# Patient Record
Sex: Male | Born: 1940 | Race: White | Hispanic: No | Smoking: Former smoker
Health system: Southern US, Community
[De-identification: ages and names within clinical notes are randomized; demographics above are authoritative.]

## PROBLEM LIST (undated history)

## (undated) DIAGNOSIS — I10 Essential (primary) hypertension: Secondary | ICD-10-CM

## (undated) DIAGNOSIS — C801 Malignant (primary) neoplasm, unspecified: Secondary | ICD-10-CM

## (undated) DIAGNOSIS — I4891 Unspecified atrial fibrillation: Secondary | ICD-10-CM

## (undated) HISTORY — PX: OTHER SURGICAL HISTORY: SHX169

## (undated) HISTORY — DX: Essential (primary) hypertension: I10

## (undated) HISTORY — DX: Unspecified atrial fibrillation: I48.91

---

## 2016-01-01 ENCOUNTER — Encounter: Payer: Self-pay | Admitting: Cardiology

## 2016-01-01 ENCOUNTER — Telehealth: Payer: Self-pay | Admitting: Cardiology

## 2016-01-01 NOTE — Telephone Encounter (Signed)
Received records from [facility] for appointment on 01/22/16 with Dr Percival Spanish.  Records given to [facility] (medical records) for Dr Hochrein's schedule on 01/22/16. lp

## 2016-01-18 ENCOUNTER — Telehealth: Payer: Self-pay | Admitting: Cardiology

## 2016-01-21 NOTE — Progress Notes (Signed)
Cardiology Office Note   Date:  01/22/2016   ID:  Jacob Singleton, DOB [birth year], MRN [id number]  PCP:  Olin Hauser, MD  Cardiologist:   Minus Breeding, MD   Chief Complaint  Patient presents with  . Atrial Fibrillation      History of Present Illness: Jacob Singleton is a 75 y.o. male who presents for evaluation of atrial fibrillation. He lives part time in [place] and was noted to have this incidentally during an exam down there. He saw primary provider here and then asked for referral to cardiology. He was given some Eliquis and started this. He stopped his other medications wondering if he could combine them. He is a very active 75 year old gentleman who looks much younger than his stated age. He does notice a little palpitation now and again since he's been told he had a fibrillation. However, he otherwise wasn't noticing it. He was able to exercise without any symptoms. The patient denies any new symptoms such as chest discomfort, neck or arm discomfort. There has been no new shortness of breath, PND or orthopnea. There have been no reported palpitations, presyncope or syncope.  He's had no prior cardiac history. He did have labs and I reviewed these.   Past Medical History:  Diagnosis Date  . A-fib ([place])   . Hypertension     Past Surgical History:  Procedure Laterality Date  . NONE       Current Outpatient Prescriptions  Medication Sig Dispense Refill  . apixaban (ELIQUIS) 5 MG TABS tablet Take 5 mg by mouth 2 (two) times daily.    Marland Kitchen HYDROCHLOROTHIAZIDE PO Take 1 tablet by mouth daily.    Marland Kitchen latanoprost (XALATAN) 0.005 % ophthalmic solution Place 1 drop into both eyes at bedtime.    . TURMERIC PO Take 1 tablet by mouth daily.     No current facility-administered medications for this visit.     Allergies:   Codeine    Social History:  The patient  reports that he quit smoking about 4 years ago. He has never used smokeless tobacco. He reports that he drinks  alcohol. He reports that he does not use drugs.   Family History:  The patient's family history includes Colon cancer in his mother; Rheum arthritis in his sister.    ROS:  Please see the history of present illness.   Otherwise, review of systems are positive for none.   All other systems are reviewed and negative.    PHYSICAL EXAM: VS:  BP 138/82   Pulse 86   Ht 5\' 11"  (1.803 m)   Wt 167 lb (75.8 kg)   BMI 23.29 kg/m  , BMI Body mass index is 23.29 kg/m. GENERAL:  Well appearing HEENT:  Pupils equal round and reactive, fundi not visualized, oral mucosa unremarkable NECK:  No jugular venous distention, waveform within normal limits, carotid upstroke brisk and symmetric, no bruits, no thyromegaly LYMPHATICS:  No cervical, inguinal adenopathy LUNGS:  Clear to auscultation bilaterally BACK:  No CVA tenderness CHEST:  Unremarkable HEART:  PMI not displaced or sustained,S1 and S2 within normal limits, no S3, , no clicks, no rubs, no murmurs, irregular ABD:  Flat, positive bowel sounds normal in frequency in pitch, no bruits, no rebound, no guarding, no midline pulsatile mass, no hepatomegaly, no splenomegaly EXT:  2 plus pulses throughout, no edema, no cyanosis no clubbing SKIN:  No rashes no nodules NEURO:  Cranial nerves II through XII grossly intact, motor grossly intact  throughout [facility]:  Cognitively intact, oriented to person place and time    EKG:  EKG is not ordered today. The ekg ordered to7/7/17 day demonstrates atrial fibrillation, rate 76, axis within normal limits, intervals within normal limits, no acute ST-T wave changes.   Recent Labs: No results found for requested labs within last 8760 hours.    Lipid Panel No results found for: CHOL, TRIG, HDL, CHOLHDL, VLDL, LDLCALC, LDLDIRECT    Wt Readings from Last 3 Encounters:  01/22/16 167 lb (75.8 kg)      Other studies Reviewed: Additional studies/ records that were reviewed today include: Office records, labs  and EKGs. Review of the above records demonstrates:  Please see elsewhere in the note.     ASSESSMENT AND PLAN:  ATRIAL FIB:  This seems to be persistent. I saw the EKG from May and again from July. We did do an EKG today but his pulse is irregular.  Mr. Jacob Singleton has a CHA2DS2 - VASc score of 2 with a risk of stroke of 2.2%.  We talked at length about the risks benefits and anticoagulation and I agree with the on Eliquis and he will continue to take this. He's not taking aspirin. I think he has good rate control but we are going to plan at least one attempt at cardioversion. This was discussed with him and he agrees to proceed. Should he converted back to fibrillation or be unsuccessful with his defibrillation would probably would pursue rate control and continued anticoagulation. I will check an echocardiogram. I didn't see a TSH and will draw this when he returns.  HTN:  He stopped taking his HCTZ recently but he does carry this diagnosis of hypertension. He's going to keep a blood pressure diary and I will treat this accordingly.  Current medicines are reviewed at length with the patient today.  The patient has concerns regarding medicines.  The following changes have been made:  As above  Labs/ tests ordered today include:   Orders Placed This Encounter  Procedures  . ELECTRICAL CARDIOVERSION  . COMPLETE METABOLIC PANEL WITH GFR  . CBC  . TSH  . INR/PT  . APTT  . ECHOCARDIOGRAM COMPLETE     Disposition:   FU with me after the DCCV.     Signed, Minus Breeding, MD  01/22/2016 1:33 PM    [facility]

## 2016-01-22 ENCOUNTER — Ambulatory Visit (INDEPENDENT_AMBULATORY_CARE_PROVIDER_SITE_OTHER): Payer: Medicare Other | Admitting: Cardiology

## 2016-01-22 ENCOUNTER — Encounter: Payer: Self-pay | Admitting: Cardiology

## 2016-01-22 VITALS — BP 138/82 | HR 86 | Ht 71.0 in | Wt 167.0 lb

## 2016-01-22 DIAGNOSIS — I10 Essential (primary) hypertension: Secondary | ICD-10-CM

## 2016-01-22 DIAGNOSIS — Z79899 Other long term (current) drug therapy: Secondary | ICD-10-CM

## 2016-01-22 DIAGNOSIS — I481 Persistent atrial fibrillation: Secondary | ICD-10-CM | POA: Diagnosis not present

## 2016-01-22 DIAGNOSIS — I4819 Other persistent atrial fibrillation: Secondary | ICD-10-CM

## 2016-01-22 NOTE — Patient Instructions (Addendum)
Medication Instructions:  Continue current medications  Labwork: NONE   Testing/Procedures: Your physician has requested that you have an echocardiogram. Echocardiography is a painless test that uses sound waves to create images of your heart. It provides your doctor with information about the size and shape of your heart and how well your heart's chambers and valves are working. This procedure takes approximately one hour. There are no restrictions for this procedure. Your physician has recommended that you have a Cardioversion (DCCV) in 3 weeks. Electrical Cardioversion uses a jolt of electricity to your heart either through paddles or wired patches attached to your chest. This is a controlled, usually prescheduled, procedure. Defibrillation is done under light anesthesia in the hospital, and you usually go home the day of the procedure. This is done to get your heart back into a normal rhythm. You are not awake for the procedure. Please see the instruction sheet given to you today.  Follow-Up: Your physician recommends that you schedule a follow-up appointment in: 1 week after Cardioversion   Any Other Special Instructions Will Be Listed Below (If Applicable).   If you need a refill on your cardiac medications before your next appointment, please call your pharmacy.

## 2016-01-25 NOTE — Telephone Encounter (Signed)
Closed encounter °

## 2016-01-28 NOTE — Addendum Note (Signed)
Addended by: Cristopher Estimable on: 01/28/2016 11:26 AM   Modules accepted: Orders

## 2016-02-02 ENCOUNTER — Ambulatory Visit (HOSPITAL_COMMUNITY): Payer: Medicare Other | Attending: Cardiology

## 2016-02-02 ENCOUNTER — Other Ambulatory Visit: Payer: Self-pay

## 2016-02-02 DIAGNOSIS — I358 Other nonrheumatic aortic valve disorders: Secondary | ICD-10-CM | POA: Insufficient documentation

## 2016-02-02 DIAGNOSIS — I481 Persistent atrial fibrillation: Secondary | ICD-10-CM | POA: Insufficient documentation

## 2016-02-02 DIAGNOSIS — I119 Hypertensive heart disease without heart failure: Secondary | ICD-10-CM | POA: Diagnosis not present

## 2016-02-02 DIAGNOSIS — I351 Nonrheumatic aortic (valve) insufficiency: Secondary | ICD-10-CM | POA: Insufficient documentation

## 2016-02-02 DIAGNOSIS — I4819 Other persistent atrial fibrillation: Secondary | ICD-10-CM

## 2016-02-02 DIAGNOSIS — I4891 Unspecified atrial fibrillation: Secondary | ICD-10-CM | POA: Diagnosis present

## 2016-02-04 ENCOUNTER — Telehealth: Payer: Self-pay | Admitting: Cardiology

## 2016-02-04 NOTE — Telephone Encounter (Signed)
New Message  Pt is returing Jacob Singleton call from yesterday but Hebert Soho is not in the office today.  Please Follow up with pt.

## 2016-02-04 NOTE — Telephone Encounter (Signed)
Returned patient call: made aware of echo results.  Notes Recorded by Minus Breeding, MD on 02/03/2016 at 7:26 AM EDT Echo is essentially unremarkable.  EF is low normal. No change in therapy. Call Mr. Vantuyl with the results and send results to Olin Hauser, MD  Sent to PCP on 8/9.     Pt verbalized understanding and denies further questions/concerns at this time.

## 2016-02-05 LAB — COMPLETE METABOLIC PANEL WITH GFR
ALT: 14 U/L (ref 9–46)
AST: 22 U/L (ref 10–35)
Albumin: 4.1 g/dL (ref 3.6–5.1)
Alkaline Phosphatase: 91 U/L (ref 40–115)
BILIRUBIN TOTAL: 0.6 mg/dL (ref 0.2–1.2)
BUN: 8 mg/dL (ref 7–25)
CHLORIDE: 101 mmol/L (ref 98–110)
CO2: 27 mmol/L (ref 20–31)
Calcium: 9.8 mg/dL (ref 8.6–10.3)
Creat: 0.98 mg/dL (ref 0.70–1.18)
GFR, EST AFRICAN AMERICAN: 87 mL/min (ref >=60)
GFR, EST NON AFRICAN AMERICAN: 76 mL/min (ref >=60)
Glucose, Bld: 93 mg/dL (ref 65–99)
Potassium: 3.9 mmol/L (ref 3.5–5.3)
Sodium: 137 mmol/L (ref 135–146)
Total Protein: 6.5 g/dL (ref 6.1–8.1)

## 2016-02-05 LAB — CBC
HEMATOCRIT: 47.3 % (ref 38.5–50.0)
Hemoglobin: 16.2 g/dL (ref 13.2–17.1)
MCH: 31.7 pg (ref 27.0–33.0)
MCHC: 34.2 g/dL (ref 32.0–36.0)
MCV: 92.6 fL (ref 80.0–100.0)
MPV: 9.4 fL (ref 7.5–12.5)
PLATELETS: 204 10*3/uL (ref 140–400)
RBC: 5.11 MIL/uL (ref 4.20–5.80)
RDW: 13.3 % (ref 11.0–15.0)
WBC: 7 10*3/uL (ref 3.8–10.8)

## 2016-02-05 LAB — APTT: aPTT: 29 s (ref 22–34)

## 2016-02-05 LAB — PROTIME-INR
INR: 1.1
Prothrombin Time: 11.4 s (ref 9.0–11.5)

## 2016-02-05 LAB — TSH: TSH: 2.05 mIU/L (ref 0.40–4.50)

## 2016-02-09 ENCOUNTER — Other Ambulatory Visit: Payer: Self-pay | Admitting: Cardiology

## 2016-02-10 ENCOUNTER — Other Ambulatory Visit: Payer: Self-pay | Admitting: Cardiovascular Disease

## 2016-02-10 ENCOUNTER — Other Ambulatory Visit: Payer: Self-pay | Admitting: Nurse Practitioner

## 2016-02-10 DIAGNOSIS — I4819 Other persistent atrial fibrillation: Secondary | ICD-10-CM

## 2016-02-11 ENCOUNTER — Ambulatory Visit (HOSPITAL_COMMUNITY)
Admission: RE | Admit: 2016-02-11 | Discharge: 2016-02-11 | Disposition: A | Discharge status: Home / Self Care | Payer: Medicare Other | Source: Ambulatory Visit | Attending: Cardiovascular Disease | Admitting: Cardiovascular Disease

## 2016-02-11 ENCOUNTER — Ambulatory Visit (HOSPITAL_COMMUNITY): Payer: Medicare Other | Admitting: Certified Registered"

## 2016-02-11 ENCOUNTER — Encounter (HOSPITAL_COMMUNITY)
Admission: RE | Disposition: A | Discharge status: Home / Self Care | Payer: Self-pay | Source: Ambulatory Visit | Attending: Cardiovascular Disease

## 2016-02-11 ENCOUNTER — Encounter (HOSPITAL_COMMUNITY): Payer: Self-pay | Admitting: *Deleted

## 2016-02-11 DIAGNOSIS — I4891 Unspecified atrial fibrillation: Secondary | ICD-10-CM | POA: Diagnosis present

## 2016-02-11 DIAGNOSIS — Z7901 Long term (current) use of anticoagulants: Secondary | ICD-10-CM | POA: Diagnosis not present

## 2016-02-11 DIAGNOSIS — Z885 Allergy status to narcotic agent status: Secondary | ICD-10-CM | POA: Insufficient documentation

## 2016-02-11 DIAGNOSIS — I4819 Other persistent atrial fibrillation: Secondary | ICD-10-CM

## 2016-02-11 DIAGNOSIS — I10 Essential (primary) hypertension: Secondary | ICD-10-CM | POA: Insufficient documentation

## 2016-02-11 DIAGNOSIS — Z87891 Personal history of nicotine dependence: Secondary | ICD-10-CM | POA: Diagnosis not present

## 2016-02-11 HISTORY — PX: CARDIOVERSION: SHX1299

## 2016-02-11 SURGERY — CARDIOVERSION
Anesthesia: General

## 2016-02-11 MED ORDER — LIDOCAINE 2% (20 MG/ML) 5 ML SYRINGE
INTRAMUSCULAR | Status: AC
  Filled 2016-02-11: qty 5

## 2016-02-11 MED ORDER — LIDOCAINE 2% (20 MG/ML) 5 ML SYRINGE
INTRAMUSCULAR | Status: DC | PRN
  Administered 2016-02-11: 100 mg via INTRAVENOUS

## 2016-02-11 MED ORDER — SODIUM CHLORIDE 0.9 % IV SOLN
INTRAVENOUS | Status: DC
  Administered 2016-02-11: 500 mL via INTRAVENOUS

## 2016-02-11 MED ORDER — SODIUM CHLORIDE 0.45 % IV SOLN: INTRAVENOUS | Status: DC

## 2016-02-11 MED ORDER — PROPOFOL 10 MG/ML IV BOLUS
INTRAVENOUS | Status: AC
  Filled 2016-02-11: qty 20

## 2016-02-11 MED ORDER — PROPOFOL 10 MG/ML IV BOLUS
INTRAVENOUS | Status: DC | PRN
  Administered 2016-02-11: 100 mg via INTRAVENOUS

## 2016-02-11 NOTE — H&P (View-Only) (Signed)
Cardiology Office Note   Date:  01/22/2016   ID:  Jacob Singleton, DOB Dec 16, 1940, MRN [id number]  PCP:  Olin Hauser, MD  Cardiologist:   Minus Breeding, MD   Chief Complaint  Patient presents with  . Atrial Fibrillation      History of Present Illness: Jacob Singleton is a 75 y.o. male who presents for evaluation of atrial fibrillation. He lives part time in [place] and was noted to have this incidentally during an exam down there. He saw primary provider here and then asked for referral to cardiology. He was given some Eliquis and started this. He stopped his other medications wondering if he could combine them. He is a very active 75 year old gentleman who looks much younger than his stated age. He does notice a little palpitation now and again since he's been told he had a fibrillation. However, he otherwise wasn't noticing it. He was able to exercise without any symptoms. The patient denies any new symptoms such as chest discomfort, neck or arm discomfort. There has been no new shortness of breath, PND or orthopnea. There have been no reported palpitations, presyncope or syncope.  He's had no prior cardiac history. He did have labs and I reviewed these.   Past Medical History:  Diagnosis Date  . A-fib ([place])   . Hypertension     Past Surgical History:  Procedure Laterality Date  . NONE       Current Outpatient Prescriptions  Medication Sig Dispense Refill  . apixaban (ELIQUIS) 5 MG TABS tablet Take 5 mg by mouth 2 (two) times daily.    Marland Kitchen HYDROCHLOROTHIAZIDE PO Take 1 tablet by mouth daily.    Marland Kitchen latanoprost (XALATAN) 0.005 % ophthalmic solution Place 1 drop into both eyes at bedtime.    . TURMERIC PO Take 1 tablet by mouth daily.     No current facility-administered medications for this visit.     Allergies:   Codeine    Social History:  The patient  reports that he quit smoking about 4 years ago. He has never used smokeless tobacco. He reports that he drinks  alcohol. He reports that he does not use drugs.   Family History:  The patient's family history includes Colon cancer in his mother; Rheum arthritis in his sister.    ROS:  Please see the history of present illness.   Otherwise, review of systems are positive for none.   All other systems are reviewed and negative.    PHYSICAL EXAM: VS:  BP 138/82   Pulse 86   Ht 5\' 11"  (1.803 m)   Wt 167 lb (75.8 kg)   BMI 23.29 kg/m  , BMI Body mass index is 23.29 kg/m. GENERAL:  Well appearing HEENT:  Pupils equal round and reactive, fundi not visualized, oral mucosa unremarkable NECK:  No jugular venous distention, waveform within normal limits, carotid upstroke brisk and symmetric, no bruits, no thyromegaly LYMPHATICS:  No cervical, inguinal adenopathy LUNGS:  Clear to auscultation bilaterally BACK:  No CVA tenderness CHEST:  Unremarkable HEART:  PMI not displaced or sustained,S1 and S2 within normal limits, no S3, , no clicks, no rubs, no murmurs, irregular ABD:  Flat, positive bowel sounds normal in frequency in pitch, no bruits, no rebound, no guarding, no midline pulsatile mass, no hepatomegaly, no splenomegaly EXT:  2 plus pulses throughout, no edema, no cyanosis no clubbing SKIN:  No rashes no nodules NEURO:  Cranial nerves II through XII grossly intact, motor grossly intact  throughout Jacob Singleton:  Cognitively intact, oriented to person place and time    EKG:  EKG is not ordered today. The ekg ordered to7/7/17 day demonstrates atrial fibrillation, rate 76, axis within normal limits, intervals within normal limits, no acute ST-T wave changes.   Recent Labs: No results found for requested labs within last 8760 hours.    Lipid Panel No results found for: CHOL, TRIG, HDL, CHOLHDL, VLDL, LDLCALC, LDLDIRECT    Wt Readings from Last 3 Encounters:  01/22/16 167 lb (75.8 kg)      Other studies Reviewed: Additional studies/ records that were reviewed today include: Office records, labs  and EKGs. Review of the above records demonstrates:  Please see elsewhere in the note.     ASSESSMENT AND PLAN:  ATRIAL FIB:  This seems to be persistent. I saw the EKG from May and again from July. We did do an EKG today but his pulse is irregular.  Jacob Singleton has a CHA2DS2 - VASc score of 2 with a risk of stroke of 2.2%.  We talked at length about the risks benefits and anticoagulation and I agree with the on Eliquis and he will continue to take this. He's not taking aspirin. I think he has good rate control but we are going to plan at least one attempt at cardioversion. This was discussed with him and he agrees to proceed. Should he converted back to fibrillation or be unsuccessful with his defibrillation would probably would pursue rate control and continued anticoagulation. I will check an echocardiogram. I didn't see a TSH and will draw this when he returns.  HTN:  He stopped taking his HCTZ recently but he does carry this diagnosis of hypertension. He's going to keep a blood pressure diary and I will treat this accordingly.  Current medicines are reviewed at length with the patient today.  The patient has concerns regarding medicines.  The following changes have been made:  As above  Labs/ tests ordered today include:   Orders Placed This Encounter  Procedures  . ELECTRICAL CARDIOVERSION  . COMPLETE METABOLIC PANEL WITH GFR  . CBC  . TSH  . INR/PT  . APTT  . ECHOCARDIOGRAM COMPLETE     Disposition:   FU with me after the DCCV.     Signed, Minus Breeding, MD  01/22/2016 1:33 PM    [facility]

## 2016-02-11 NOTE — Discharge Instructions (Signed)
Electrical Cardioversion, Care After °Refer to this sheet in the next few weeks. These instructions provide you with information on caring for yourself after your procedure. Your health care provider may also give you more specific instructions. Your treatment has been planned according to current medical practices, but problems sometimes occur. Call your health care provider if you have any problems or questions after your procedure. °WHAT TO EXPECT AFTER THE PROCEDURE °After your procedure, it is typical to have the following sensations: °· Some redness on the skin where the shocks were delivered. If this is tender, a sunburn lotion or hydrocortisone cream may help. °· Possible return of an abnormal heart rhythm within hours or days after the procedure. °HOME CARE INSTRUCTIONS °· Take medicines only as directed by your health care provider. Be sure you understand how and when to take your medicine. °· Learn how to feel your pulse and check it often. °· Limit your activity for 48 hours after the procedure or as directed by your health care provider. °· Avoid or minimize caffeine and other stimulants as directed by your health care provider. °SEEK MEDICAL CARE IF: °· You feel like your heart is beating too fast or your pulse is not regular. °· You have any questions about your medicines. °· You have bleeding that will not stop. °SEEK IMMEDIATE MEDICAL CARE IF: °· You are dizzy or feel faint. °· It is hard to breathe or you feel short of breath. °· There is a change in discomfort in your chest. °· Your speech is slurred or you have trouble moving an arm or leg on one side of your body. °· You get a serious muscle cramp that does not go away. °· Your fingers or toes turn cold or blue. °  °This information is not intended to replace advice given to you by your health care provider. Make sure you discuss any questions you have with your health care provider. °  °Document Released: 04/03/2013 Document Revised: 07/04/2014  Document Reviewed: 04/03/2013 °Elsevier Interactive Patient Education ©2016 [employer] ° °

## 2016-02-11 NOTE — Interval H&P Note (Signed)
History and Physical Interval Note:  02/11/2016 12:36 PM  Jacob Singleton  has presented today for surgery, with the diagnosis of AFIB  The various methods of treatment have been discussed with the patient and family. After consideration of risks, benefits and other options for treatment, the patient has consented to  Procedure(s): CARDIOVERSION (N/A) as a surgical intervention .  The patient's history has been reviewed, patient examined, no change in status, stable for surgery.  I have reviewed the patient's chart and labs.  Questions were answered to the patient's satisfaction.     Jenkins Rouge

## 2016-02-11 NOTE — CV Procedure (Addendum)
DCC: Anesthesia Propofol  ON Rx Eliquis NPO Shocked x 3 120, 150 and 200 Joules Had long pauses but no sinus capture  Failed [facility]  Per Dr Percival Spanish likely pursue rate control and anticoagulation strategy  Jenkins Rouge

## 2016-02-11 NOTE — Transfer of Care (Signed)
Immediate Anesthesia Transfer of Care Note  Patient: Jacob Singleton  Procedure(s) Performed: Procedure(s): CARDIOVERSION (N/A)  Patient Location: Endoscopy Unit  Anesthesia Type:MAC  Level of Consciousness: awake  Airway & Oxygen Therapy: Patient Spontanous Breathing  Post-op Assessment: Report given to RN  Post vital signs: Reviewed and stable  Last Vitals:  Vitals:   02/11/16 1314 02/11/16 1315  BP:  101/61  Pulse: 94 85  Resp: 19 (!) 22    Last Pain: There were no vitals filed for this visit.       Complications: No apparent anesthesia complications

## 2016-02-11 NOTE — Anesthesia Preprocedure Evaluation (Addendum)
Anesthesia Evaluation  Patient identified by MRN, date of birth, ID band Patient awake    Reviewed: Allergy & Precautions, NPO status , Patient's Chart, lab work & pertinent test results  Airway Mallampati: II  TM Distance: >3 FB Neck ROM: Full    Dental no notable dental hx. (+) Teeth Intact, Dental Advisory Given   Pulmonary neg pulmonary ROS, former smoker,    Pulmonary exam normal breath sounds clear to auscultation       Cardiovascular hypertension, Normal cardiovascular exam+ dysrhythmias  Rhythm:Irregular Rate:Normal     Neuro/Psych negative neurological ROS  negative psych ROS   GI/Hepatic negative GI ROS, Neg liver ROS,   Endo/Other  negative endocrine ROS  Renal/GU negative Renal ROS  negative genitourinary   Musculoskeletal negative musculoskeletal ROS (+)   Abdominal   Peds negative pediatric ROS (+)  Hematology negative hematology ROS (+)   Anesthesia Other Findings   Reproductive/Obstetrics negative OB ROS                            Anesthesia Physical Anesthesia Plan  ASA: II  Anesthesia Plan: General   Post-op Pain Management:    Induction: Intravenous  Airway Management Planned: Mask  Additional Equipment:   Intra-op Plan:   Post-operative Plan:   Informed Consent: I have reviewed the patients History and Physical, chart, labs and discussed the procedure including the risks, benefits and alternatives for the proposed anesthesia with the patient or authorized representative who has indicated his/her understanding and acceptance.   Dental advisory given  Plan Discussed with: CRNA  Anesthesia Plan Comments:         Anesthesia Quick Evaluation

## 2016-02-12 ENCOUNTER — Encounter (HOSPITAL_COMMUNITY): Payer: Self-pay | Admitting: Cardiovascular Disease

## 2016-02-13 NOTE — Anesthesia Postprocedure Evaluation (Signed)
Anesthesia Post Note  Patient: Jacob Singleton  Procedure(s) Performed: Procedure(s) (LRB): CARDIOVERSION (N/A)  Patient location during evaluation: Endoscopy Anesthesia Type: General Level of consciousness: sedated and patient cooperative Pain management: pain level controlled Vital Signs Assessment: post-procedure vital signs reviewed and stable Respiratory status: spontaneous breathing Cardiovascular status: stable Anesthetic complications: no    Last Vitals:  Vitals:   02/11/16 1334 02/11/16 1344  BP: (!) 130/99 (!) 154/77  Pulse: 86 78  Resp: 15 20    Last Pain: There were no vitals filed for this visit.               Nolon Nations

## 2016-02-17 NOTE — Progress Notes (Signed)
Cardiology Office Note   Date:  02/19/2016   ID:  Jacob Singleton, DOB [birth year], MRN [id number]  PCP:  Olin Hauser, MD  Cardiologist:   Minus Breeding, MD   Chief Complaint  Patient presents with  . Atrial Fibrillation      History of Present Illness: Jacob Singleton is a 75 y.o. male who presents for evaluation of atrial fibrillation. He lives part time in [place] and was noted to have this incidentally during an exam down there. He saw primary provider here and then asked for referral to cardiology. He was given some Eliquis and started this. After my initial visit with this patient I sent him for DCCV.  He failed to convert after 3 attempts and had sinus pauses after each.  I reviewed these records.  He returns for follow up.  Of note, I did send him for an echocardiogram which demonstrated a low normal EF without any significant LAE.  He returns for follow up.  He says he doesn't really notice the atrial fibrillation except rarely. He is active.  The patient denies any new symptoms such as chest discomfort, neck or arm discomfort. There has been no new shortness of breath, PND or orthopnea. There have been no reported palpitations, presyncope or syncope.   Past Medical History:  Diagnosis Date  . A-fib ([place])   . Hypertension     Past Surgical History:  Procedure Laterality Date  . CARDIOVERSION N/A 02/11/2016   Procedure: CARDIOVERSION;  Surgeon: Josue Hector, MD;  Location: [facility] ENDOSCOPY;  Service: Cardiovascular;  Laterality: N/A;  . NONE       Current Outpatient Prescriptions  Medication Sig Dispense Refill  . apixaban (ELIQUIS) 5 MG TABS tablet Take 5 mg by mouth 2 (two) times daily.    . Coenzyme Q10 (COQ10 PO) Take 1 tablet by mouth daily.    . hydrochlorothiazide (HYDRODIURIL) 25 MG tablet Take 25 mg by mouth daily.    Marland Kitchen latanoprost (XALATAN) 0.005 % ophthalmic solution Place 1 drop into both eyes at bedtime.    . LUTEIN PO Take 1 tablet by mouth daily.      . Multiple Vitamin (MULTIVITAMIN WITH MINERALS) TABS tablet Take 1 tablet by mouth daily.    . TURMERIC PO Take 1 tablet by mouth daily.     No current facility-administered medications for this visit.     Allergies:   Codeine    ROS:  Please see the history of present illness.   Otherwise, review of systems are positive for none.   All other systems are reviewed and negative.    PHYSICAL EXAM: VS:  BP 132/88   Pulse 94   Ht 5\' 11"  (1.803 m)   Wt 168 lb (76.2 kg)   BMI 23.43 kg/m  , BMI Body mass index is 23.43 kg/m. GENERAL:  Well appearing NECK:  No jugular venous distention, waveform within normal limits, carotid upstroke brisk and symmetric, no bruits, no thyromegaly LYMPHATICS:  No cervical, inguinal adenopathy LUNGS:  Clear to auscultation bilaterally BACK:  No CVA tenderness CHEST:  Unremarkable HEART:  PMI not displaced or sustained,S1 and S2 within normal limits, no S3, , no clicks, no rubs, no murmurs, irregular ABD:  Flat, positive bowel sounds normal in frequency in pitch, no bruits, no rebound, no guarding, no midline pulsatile mass, no hepatomegaly, no splenomegaly EXT:  2 plus pulses throughout, no edema, no cyanosis no clubbing   EKG:  EKG is ordered today. Atrial fibrillation,  rate 94, axis within normal limits, intervals within normal limits with premature ventricular contractions.   Recent Labs: 02/04/2016: ALT 14; BUN 8; Creat 0.98; Hemoglobin 16.2; Platelets 204; Potassium 3.9; Sodium 137; TSH 2.05    Lipid Panel No results found for: CHOL, TRIG, HDL, CHOLHDL, VLDL, LDLCALC, LDLDIRECT    Wt Readings from Last 3 Encounters:  02/19/16 168 lb (76.2 kg)  01/22/16 167 lb (75.8 kg)      Other studies Reviewed: Additional studies/ records that were reviewed today include: Office records, labs and EKGs. Review of the above records demonstrates:  Please see elsewhere in the note.     ASSESSMENT AND PLAN:  ATRIAL FIB:    Mr. Jacob Singleton has a  CHA2DS2 - VASc score of 2 with a risk of stroke of 2.2%.  He will remain on anticoagulation.  He failed cardioversion. He has had reasonable rate control.  I will place a 24 hour Holter to follow rate control.    HTN:  BP  Is controlled.  Continue current therapy.   Current medicines are reviewed at length with the patient today.  The patient has no concerns regarding medicines.  The following changes have been made: None  Labs/ tests ordered today include:   Orders Placed This Encounter  Procedures  . Holter monitor - 24 hour  . EKG 12-Lead     Disposition:   FU with me in six months.    Signed, Minus Breeding, MD  02/19/2016 8:53 AM    [place]

## 2016-02-18 ENCOUNTER — Encounter: Payer: Self-pay | Admitting: Cardiology

## 2016-02-19 ENCOUNTER — Ambulatory Visit (INDEPENDENT_AMBULATORY_CARE_PROVIDER_SITE_OTHER): Payer: Medicare Other | Admitting: Cardiology

## 2016-02-19 ENCOUNTER — Other Ambulatory Visit: Payer: Self-pay | Admitting: Physician Assistant

## 2016-02-19 ENCOUNTER — Encounter: Payer: Self-pay | Admitting: Cardiology

## 2016-02-19 VITALS — BP 132/88 | HR 94 | Ht 71.0 in | Wt 168.0 lb

## 2016-02-19 DIAGNOSIS — I10 Essential (primary) hypertension: Secondary | ICD-10-CM

## 2016-02-19 DIAGNOSIS — I481 Persistent atrial fibrillation: Secondary | ICD-10-CM

## 2016-02-19 DIAGNOSIS — I4819 Other persistent atrial fibrillation: Secondary | ICD-10-CM

## 2016-02-19 NOTE — Patient Instructions (Signed)
Medication Instructions:  Continue current medications  Labwork: None Ordered  Testing/Procedures: Your physician has recommended that you wear a 24 hour holter monitor. Holter monitors are medical devices that record the heart's electrical activity. Doctors most often use these monitors to diagnose arrhythmias. Arrhythmias are problems with the speed or rhythm of the heartbeat. The monitor is a small, portable device. You can wear one while you do your normal daily activities. This is usually used to diagnose what is causing palpitations/syncope (passing out).  Follow-Up: Your physician wants you to follow-up in: 6 Months. You will receive a reminder letter in the mail two months in advance. If you don't receive a letter, please call our office to schedule the follow-up appointment.   Any Other Special Instructions Will Be Listed Below (If Applicable).   If you need a refill on your cardiac medications before your next appointment, please call your pharmacy.

## 2016-03-01 ENCOUNTER — Ambulatory Visit (INDEPENDENT_AMBULATORY_CARE_PROVIDER_SITE_OTHER): Payer: Medicare Other

## 2016-03-01 DIAGNOSIS — I4819 Other persistent atrial fibrillation: Secondary | ICD-10-CM

## 2016-03-01 DIAGNOSIS — I481 Persistent atrial fibrillation: Secondary | ICD-10-CM

## 2016-10-31 NOTE — Progress Notes (Signed)
Cardiology Office Note   Date:  11/02/2016   ID:  HUSAIN COSTABILE, DOB 21-Jul-1940, MRN [id number]  PCP:  Olin Hauser, MD  Cardiologist:   Minus Breeding, MD   Chief Complaint  Patient presents with  . Atrial Fibrillation      History of Present Illness: Jacob Singleton is a 76 y.o. male who presents for evaluation of atrial fibrillation. He lives part time in [place] and was noted to have this incidentally during an exam down there. He saw primary provider here and then asked for referral to cardiology. He was given some Eliquis and started this. After my initial visit with this patient I sent him for DCCV.  He failed to convert after 3 attempts and had sinus pauses after each. I did send him for an echocardiogram which demonstrated a low normal EF without any significant LAE.  A Holter after the last visit demonstrated good rate control.  He returns for follow up. He is doing well.  He exercises.  The patient denies any new symptoms such as chest discomfort, neck or arm discomfort. There has been no new shortness of breath, PND or orthopnea. There have been no reported palpitations, presyncope or syncope.  Past Medical History:  Diagnosis Date  . A-fib ([place])   . Hypertension     Past Surgical History:  Procedure Laterality Date  . CARDIOVERSION N/A 02/11/2016   Procedure: CARDIOVERSION;  Surgeon: Josue Hector, MD;  Location: [facility] ENDOSCOPY;  Service: Cardiovascular;  Laterality: N/A;  . NONE       Current Outpatient Prescriptions  Medication Sig Dispense Refill  . apixaban (ELIQUIS) 5 MG TABS tablet Take 5 mg by mouth 2 (two) times daily.    . Coenzyme Q10 (COQ10 PO) Take 1 tablet by mouth daily.    . hydrochlorothiazide (HYDRODIURIL) 25 MG tablet Take 25 mg by mouth daily.    Marland Kitchen latanoprost (XALATAN) 0.005 % ophthalmic solution Place 1 drop into both eyes at bedtime.    . Multiple Vitamin (MULTIVITAMIN WITH MINERALS) TABS tablet Take 1 tablet by mouth daily.    .  TURMERIC PO Take 1 tablet by mouth daily.     No current facility-administered medications for this visit.     Allergies:   Codeine    ROS:  Please see the history of present illness.   Otherwise, review of systems are positive for none.   All other systems are reviewed and negative.    PHYSICAL EXAM: VS:  BP 128/80 (BP Location: Left Arm, Patient Position: Sitting, Cuff Size: Normal)   Pulse 76   Ht 5\' 11"  (1.803 m)   Wt 167 lb (75.8 kg)   SpO2 99%   BMI 23.29 kg/m  , BMI Body mass index is 23.29 kg/m. GENERAL:  Well appearing, and in no distress NECK:  No jugular venous distention, waveform within normal limits, carotid upstroke brisk and symmetric, no bruits, no thyromegaly LUNGS:  Clear to auscultation bilaterally BACK:  No CVA tenderness CHEST:  Unremarkable HEART:  PMI not displaced or sustained,S1 and S2 within normal limits, no S3, , no clicks, no rubs, no murmurs, irregular ABD:  Flat, positive bowel sounds normal in frequency in pitch, no bruits, no rebound, no guarding, no midline pulsatile mass, no hepatomegaly, no splenomegaly EXT:  2 plus pulses throughout, no edema, no cyanosis no clubbing   EKG:  EKG is  ordered today. Atrial fibrillation, rate 84, axis within normal limits, intervals within normal limits with premature  ventricular contractions.   Recent Labs: 02/04/2016: ALT 14; BUN 8; Creat 0.98; Hemoglobin 16.2; Platelets 204; Potassium 3.9; Sodium 137; TSH 2.05    Lipid Panel No results found for: CHOL, TRIG, HDL, CHOLHDL, VLDL, LDLCALC, LDLDIRECT    Wt Readings from Last 3 Encounters:  11/01/16 167 lb (75.8 kg)  02/19/16 168 lb (76.2 kg)  01/22/16 167 lb (75.8 kg)      Other studies Reviewed: Additional studies/ records that were reviewed today include: Labs from [facility] Review of the above records demonstrates:  No significant abnormalities.   ASSESSMENT AND PLAN:  ATRIAL FIB:    Jacob Singleton has a CHA2DS2 - VASc score of 2 with a risk of  stroke of 2.2%.  He will remain on anticoagulation.  He failed cardioversion. He has had reasonable rate control.  No change in therapy is planned  HTN:  BP is  controlled.  Continue current therapy.   Current medicines are reviewed at length with the patient today.  The patient has no concerns regarding medicines.  The following changes have been made: None  Labs/ tests ordered today include:  None  Orders Placed This Encounter  Procedures  . EKG 12-Lead     Disposition:   FU with me in 12 months.    Signed, Minus Breeding, MD  11/02/2016 9:25 AM    [facility]

## 2016-11-01 ENCOUNTER — Encounter: Payer: Self-pay | Admitting: Cardiology

## 2016-11-01 ENCOUNTER — Ambulatory Visit (INDEPENDENT_AMBULATORY_CARE_PROVIDER_SITE_OTHER): Payer: Medicare Other | Admitting: Cardiology

## 2016-11-01 VITALS — BP 128/80 | HR 76 | Ht 71.0 in | Wt 167.0 lb

## 2016-11-01 DIAGNOSIS — I481 Persistent atrial fibrillation: Secondary | ICD-10-CM | POA: Diagnosis not present

## 2016-11-01 DIAGNOSIS — I4819 Other persistent atrial fibrillation: Secondary | ICD-10-CM

## 2016-11-01 DIAGNOSIS — I10 Essential (primary) hypertension: Secondary | ICD-10-CM

## 2016-11-01 NOTE — Patient Instructions (Signed)
Medication Instructions:   No change  Labwork:   None ordered  Testing/Procedures:  None ordered  Follow-Up: 1 year with Dr. Percival Spanish   If you need a refill on your cardiac medications before your next appointment, please call your pharmacy.

## 2016-11-02 ENCOUNTER — Encounter: Payer: Self-pay | Admitting: Cardiology

## 2017-10-30 NOTE — Progress Notes (Signed)
Cardiology Office Note   Date:  10/31/2017   ID:  JURON VORHEES, DOB 30-Aug-1940, MRN [id number]  PCP:  Ivan Anchors, MD  Cardiologist:   Minus Breeding, MD   Chief Complaint  Patient presents with  . Atrial Fibrillation      History of Present Illness: Jacob Singleton is a 77 y.o. male who presents for evaluation of atrial fibrillation.  He failed to convert to NSR with DCCV.  EF was low normal on echo.  The patient denies any new symptoms such as chest discomfort, neck or arm discomfort. There has been no new shortness of breath, PND or orthopnea. There have been no reported palpitations, presyncope or syncope.  His wife of 107 years developed cancer and died last year after 4 months.  He is grieving with this.     Past Medical History:  Diagnosis Date  . A-fib ([place])   . Hypertension     Past Surgical History:  Procedure Laterality Date  . CARDIOVERSION N/A 02/11/2016   Procedure: CARDIOVERSION;  Surgeon: Josue Hector, MD;  Location: [facility] ENDOSCOPY;  Service: Cardiovascular;  Laterality: N/A;  . NONE       Current Outpatient Medications  Medication Sig Dispense Refill  . apixaban (ELIQUIS) 5 MG TABS tablet Take 5 mg by mouth 2 (two) times daily.    . Coenzyme Q10 (COQ10 PO) Take 1 tablet by mouth daily.    . hydrochlorothiazide (HYDRODIURIL) 25 MG tablet Take 25 mg by mouth daily.    Marland Kitchen latanoprost (XALATAN) 0.005 % ophthalmic solution Place 1 drop into both eyes at bedtime.    . Multiple Vitamin (MULTIVITAMIN WITH MINERALS) TABS tablet Take 1 tablet by mouth daily.    . TURMERIC PO Take 1 tablet by mouth daily.     No current facility-administered medications for this visit.     Allergies:   Codeine    ROS:  Please see the history of present illness.   Otherwise, review of systems are positive for none.   All other systems are reviewed and negative.    PHYSICAL EXAM: VS:  BP 139/90   Pulse 90   Ht 5\' 11"  (1.803 m)   Wt 159 lb 12.8 oz (72.5 kg)   BMI  22.29 kg/m  , BMI Body mass index is 22.29 kg/m.  GENERAL:  Well appearing NECK:  No jugular venous distention, waveform within normal limits, carotid upstroke brisk and symmetric, no bruits, no thyromegaly LUNGS:  Clear to auscultation bilaterally CHEST:  Unremarkable HEART:  PMI not displaced or sustained,S1 and S2 within normal limits, no S3,  no clicks, no rubs, no murmurs, irregular ABD:  Flat, positive bowel sounds normal in frequency in pitch, no bruits, no rebound, no guarding, no midline pulsatile mass, no hepatomegaly, no splenomegaly EXT:  2 plus pulses throughout, no edema, no cyanosis no clubbing     EKG:  EKG is  ordered today. Atrial fibrillation, rate 90 , axis within normal limits, intervals within normal limits with premature ventricular contractions.   Recent Labs: No results found for requested labs within last 8760 hours.    Lipid Panel No results found for: CHOL, TRIG, HDL, CHOLHDL, VLDL, LDLCALC, LDLDIRECT    Wt Readings from Last 3 Encounters:  10/31/17 159 lb 12.8 oz (72.5 kg)  11/01/16 167 lb (75.8 kg)  02/19/16 168 lb (76.2 kg)      Other studies Reviewed: Additional studies/ records that were reviewed today include: None Review of the  above records demonstrates:  ASSESSMENT AND PLAN:  ATRIAL FIB:    Mr. CARVER MURAKAMI has a CHA2DS2 - VASc score of 2 with a risk of stroke of 2.2%.  He will remain on meds as listed.   HTN:  BP is controlled.  He will continue meds as listed.   AAA:  This was found at the [place] and is being followed.   DECREASED EF:  I will follow up with an echo.    Current medicines are reviewed at length with the patient today.  The patient has no concerns regarding medicines.  The following changes have been made: None  Labs/ tests ordered today include:    Orders Placed This Encounter  Procedures  . EKG 12-Lead  . ECHOCARDIOGRAM COMPLETE     Disposition:   FU with me in 12 months.    Signed, Minus Breeding,  MD  10/31/2017 5:00 PM    [place]

## 2017-10-31 ENCOUNTER — Ambulatory Visit (INDEPENDENT_AMBULATORY_CARE_PROVIDER_SITE_OTHER): Payer: Medicare Other | Admitting: Cardiology

## 2017-10-31 ENCOUNTER — Encounter: Payer: Self-pay | Admitting: Cardiology

## 2017-10-31 VITALS — BP 139/90 | HR 90 | Ht 71.0 in | Wt 159.8 lb

## 2017-10-31 DIAGNOSIS — I481 Persistent atrial fibrillation: Secondary | ICD-10-CM | POA: Diagnosis not present

## 2017-10-31 DIAGNOSIS — I4819 Other persistent atrial fibrillation: Secondary | ICD-10-CM

## 2017-10-31 NOTE — Patient Instructions (Signed)
Medication Instructions:  Continue current medications  If you need a refill on your cardiac medications before your next appointment, please call your pharmacy.  Labwork:   Testing/Procedures: Your physician has requested that you have an echocardiogram. Echocardiography is a painless test that uses sound waves to create images of your heart. It provides your doctor with information about the size and shape of your heart and how well your heart's chambers and valves are working. This procedure takes approximately one hour. There are no restrictions for this procedure.  Follow-Up: Your physician wants you to follow-up in: 1 Year. You should receive a reminder letter in the mail two months in advance. If you do not receive a letter, please call our office [phone]     Thank you for choosing [facility] at [facility]!!

## 2017-11-14 ENCOUNTER — Other Ambulatory Visit: Payer: Self-pay

## 2017-11-14 ENCOUNTER — Ambulatory Visit (HOSPITAL_COMMUNITY): Payer: Medicare Other | Attending: Cardiovascular Disease

## 2017-11-14 DIAGNOSIS — I4891 Unspecified atrial fibrillation: Secondary | ICD-10-CM | POA: Diagnosis present

## 2017-11-14 DIAGNOSIS — I481 Persistent atrial fibrillation: Secondary | ICD-10-CM | POA: Diagnosis not present

## 2017-11-14 DIAGNOSIS — I10 Essential (primary) hypertension: Secondary | ICD-10-CM | POA: Insufficient documentation

## 2017-11-14 DIAGNOSIS — Z87891 Personal history of nicotine dependence: Secondary | ICD-10-CM | POA: Diagnosis not present

## 2017-11-14 DIAGNOSIS — I340 Nonrheumatic mitral (valve) insufficiency: Secondary | ICD-10-CM | POA: Insufficient documentation

## 2017-11-14 DIAGNOSIS — I4819 Other persistent atrial fibrillation: Secondary | ICD-10-CM

## 2017-11-22 ENCOUNTER — Telehealth: Payer: Self-pay | Admitting: Cardiology

## 2017-11-22 NOTE — Telephone Encounter (Signed)
New Message    Patient is returning call in reference to echocardiogram. Please call.

## 2017-11-22 NOTE — Telephone Encounter (Signed)
Patient made aware of results and verbalized his understanding.   Notes recorded by Minus Breeding, MD on 11/16/2017 at 2:42 PM EDT EF looks good. No other significant abnormalities. Call Mr. Daddona with the results and send results to Ivan Anchors, MD

## 2019-02-10 NOTE — Progress Notes (Signed)
Cardiology Office Note   Date:  02/11/2019   ID:  Jacob Singleton, DOB 12-24-1940, MRN [id number]  PCP:  Ivan Anchors, MD  Cardiologist:   Minus Breeding, MD  .  Dr. from driving her up and flying back Chief Complaint  Patient presents with  . Atrial Fibrillation      History of Present Illness: Jacob Singleton is a 78 y.o. male who presents for evaluation of atrial fibrillation.  He failed to convert to NSR with DCCV.  Since I last saw him he has done well.  The patient denies any new symptoms such as chest discomfort, neck or arm discomfort. There has been no new shortness of breath, PND or orthopnea. There have been no reported palpitations, presyncope or syncope.  He is disappointed because he cannot exercise.  However, he remains very active doing things like mowing the lawn.  He has no symptoms related to this.   Past Medical History:  Diagnosis Date  . A-fib ([place])   . Hypertension     Past Surgical History:  Procedure Laterality Date  . CARDIOVERSION N/A 02/11/2016   Procedure: CARDIOVERSION;  Surgeon: Josue Hector, MD;  Location: [facility] ENDOSCOPY;  Service: Cardiovascular;  Laterality: N/A;  . NONE       Current Outpatient Medications  Medication Sig Dispense Refill  . apixaban (ELIQUIS) 5 MG TABS tablet Take 5 mg by mouth 2 (two) times daily.    . Coenzyme Q10 (COQ10 PO) Take 1 tablet by mouth daily.    . hydrochlorothiazide (HYDRODIURIL) 25 MG tablet Take 25 mg by mouth daily.    Marland Kitchen latanoprost (XALATAN) 0.005 % ophthalmic solution Place 1 drop into both eyes at bedtime.    . Multiple Vitamin (MULTIVITAMIN WITH MINERALS) TABS tablet Take 1 tablet by mouth daily.    Marland Kitchen POTASSIUM PO Take by mouth.    . TURMERIC PO Take 1 tablet by mouth daily.     No current facility-administered medications for this visit.     Allergies:   Codeine    ROS:  Please see the history of present illness.   Otherwise, review of systems are positive for none.   All other systems  are reviewed and negative.    PHYSICAL EXAM: VS:  BP (!) 141/93   Pulse 84   Temp (!) 97.3 F (36.3 C)   Ht 5\' 11"  (1.803 m)   Wt 160 lb 6.4 oz (72.8 kg)   SpO2 99%   BMI 22.37 kg/m  , BMI Body mass index is 22.37 kg/m.  GENERAL:  Well appearing NECK:  No jugular venous distention, waveform within normal limits, carotid upstroke brisk and symmetric, no bruits, no thyromegaly LUNGS:  Clear to auscultation bilaterally CHEST:  Unremarkable HEART:  PMI not displaced or sustained,S1 and S2 within normal limits, no S3,  no clicks, no rubs, no murmurs, irregular ABD:  Flat, positive bowel sounds normal in frequency in pitch, no bruits, no rebound, no guarding, no midline pulsatile mass, no hepatomegaly, no splenomegaly EXT:  2 plus pulses throughout, no edema, no cyanosis no clubbing  EKG:  EKG is  ordered today. Atrial fibrillation, rate 84 , axis within normal limits, intervals within normal limits with premature ventricular contractions.   Recent Labs: No results found for requested labs within last 8760 hours.    Lipid Panel No results found for: CHOL, TRIG, HDL, CHOLHDL, VLDL, LDLCALC, LDLDIRECT    Wt Readings from Last 3 Encounters:  02/11/19 160 lb  6.4 oz (72.8 kg)  10/31/17 159 lb 12.8 oz (72.5 kg)  11/01/16 167 lb (75.8 kg)      Other studies Reviewed: Additional studies/ records that were reviewed today include: Labs Review of the above records demonstrates:  See elsewhere  ASSESSMENT AND PLAN:  ATRIAL FIB:    Mr. [name] has a CHA2DS2 - VASc score of 2 with a risk of stroke of 2.2%.  He will remain on meds as listed.  Of note all of his left hip: Primary provider had to be 81.  He is up-to-date she says with blood work including CBC for management.  HTN:  BP is controlled.  No change in therapy.   AAA:   This is followed at the [place].  DECREASED EF:   This was normal on echo in May of last year.  No further testing.    Current medicines are reviewed at  length with the patient today.  The patient has no concerns regarding medicines.  The following changes have been made: None  Labs/ tests ordered today include:  None  Orders Placed This Encounter  Procedures  . EKG 12-Lead     Disposition:   FU with me in 12 months.    Signed, Minus Breeding, MD  02/11/2019 10:20 AM    [facility]

## 2019-02-11 ENCOUNTER — Other Ambulatory Visit: Payer: Self-pay

## 2019-02-11 ENCOUNTER — Encounter: Payer: Self-pay | Admitting: Cardiology

## 2019-02-11 ENCOUNTER — Ambulatory Visit (INDEPENDENT_AMBULATORY_CARE_PROVIDER_SITE_OTHER): Payer: Medicare Other | Admitting: Cardiology

## 2019-02-11 VITALS — BP 141/93 | HR 84 | Temp 97.3°F | Ht 71.0 in | Wt 160.4 lb

## 2019-02-11 DIAGNOSIS — I4819 Other persistent atrial fibrillation: Secondary | ICD-10-CM

## 2019-02-11 DIAGNOSIS — I714 Abdominal aortic aneurysm, without rupture, unspecified: Secondary | ICD-10-CM

## 2019-02-11 DIAGNOSIS — I10 Essential (primary) hypertension: Secondary | ICD-10-CM

## 2019-02-11 NOTE — Patient Instructions (Signed)

## 2019-10-11 ENCOUNTER — Encounter: Payer: Self-pay | Admitting: Cardiology

## 2019-12-04 ENCOUNTER — Telehealth: Payer: Self-pay | Admitting: *Deleted

## 2019-12-04 DIAGNOSIS — Z79899 Other long term (current) drug therapy: Secondary | ICD-10-CM

## 2019-12-04 DIAGNOSIS — Z01810 Encounter for preprocedural cardiovascular examination: Secondary | ICD-10-CM

## 2019-12-04 DIAGNOSIS — I4819 Other persistent atrial fibrillation: Secondary | ICD-10-CM

## 2019-12-04 NOTE — Telephone Encounter (Signed)
   [facility] Pre-operative Risk Assessment    HEARTCARE STAFF: - Please ensure there is not already an duplicate clearance open for this procedure. - Under Visit Info/Reason for Call, type in Other and utilize the format Clearance MM/DD/YY or Clearance TBD. Do not use dashes or single digits. - If request is for dental extraction, please clarify the # of teeth to be extracted.  Request for surgical clearance:  1. What type of surgery is being performed? COLONOSCOPY  2. When is this surgery scheduled? 12/10/19   3. What type of clearance is required (medical clearance vs. Pharmacy clearance to hold med vs. Both)? BOTH  4. Are there any medications that need to be held prior to surgery and how long?ELIQUIS-4 DAYS PRIOR   5. Practice name and name of physician performing surgery? DR MELISSA KATOPES   6. What is the office phone number? [phone]   7.   What is the office fax number? [phone]  8.   Anesthesia type (None, local, MAC, general) ? UNKNOWN   Jacob Singleton 12/04/2019, 4:51 PM  _________________________________________________________________   (provider comments below)

## 2019-12-05 NOTE — Telephone Encounter (Signed)
Patient with diagnosis of afib on Eliquis for anticoagulation.    Procedure: colonoscopy Date of procedure: 12/10/19  CHADS2-VASc score of 3 (age x2, HTN).  Pt has not had lab work done in 3 years. Do not see any labs in [facility] or Care Everywhere. He will need a CBC and BMET prior to clearance.  Request is to hold Eliquis for 4 days prior to colonoscopy. This is excessive - recommend holding 1-2 days max if renal function is normal on labwork.

## 2019-12-05 NOTE — Telephone Encounter (Signed)
LM2CB-needs lab and appt for cardiac clearance for colonoscopy.

## 2019-12-05 NOTE — Telephone Encounter (Signed)
°  Primary Cardiologist:No primary care provider on file.  Chart reviewed as part of pre-operative protocol coverage. Because of Jacob Singleton's past medical history and time since last visit, he/she will require a follow-up visit in order to better assess preoperative cardiovascular risk.  Patient has also not had lab work done in over 3 years.  He will also need labs completed before clearance can be provided.  (CBC and BMP)  Pre-op covering staff: - Please schedule appointment and call patient to inform them. - Please contact requesting surgeon's office via preferred method (i.e, phone, fax) to inform them of need for appointment prior to surgery.  If applicable, this message will also be routed to pharmacy pool and/or primary cardiologist for input on holding anticoagulant/antiplatelet agent as requested below so that this information is available at time of patient's appointment.   Deberah Pelton, NP  12/05/2019, 11:16 AM

## 2019-12-05 NOTE — Telephone Encounter (Signed)
Follow Up  Nurse from Dr. Shary Key office is calling in to follow up about clearance. States that if she doesn't get a response today that the colonoscopy will have to be cancelled. Please call and assist.

## 2019-12-06 NOTE — Telephone Encounter (Signed)
Called pt and discussed lab draw. Pt states that "I do not do lab work". Informed pt that we cannot give clearance nor will requesting provider. Passed on message from GI that they will cancel colonoscopy if lab is not done. Pt states that he will come in today for lab draw.  Future order entered, will await result

## 2019-12-06 NOTE — Telephone Encounter (Signed)
Follow    Robin from [facility] called and would like a call back about stopping the patient's medication.

## 2019-12-06 NOTE — Telephone Encounter (Signed)
Pt brought in recent labs done in April, d/w Raquel, [facility] she states that labs are WNL, ok to follow recommendation below by [place] , [facility].  Request is to hold Eliquis for 4 days prior to colonoscopy. This is excessive - recommend holding 1-2 days max if renal function is normal on labwork.  Faxed to requesting party via [place] fax function

## 2019-12-06 NOTE — Addendum Note (Signed)
Addended by: Waylan Rocher on: 12/06/2019 09:21 AM   Modules accepted: Orders

## 2020-02-10 DIAGNOSIS — I482 Chronic atrial fibrillation, unspecified: Secondary | ICD-10-CM | POA: Insufficient documentation

## 2020-02-10 DIAGNOSIS — Z7189 Other specified counseling: Secondary | ICD-10-CM | POA: Insufficient documentation

## 2020-02-10 NOTE — Progress Notes (Signed)
Cardiology Office Note   Date:  02/11/2020   ID:  Jacob Singleton, DOB 09-15-1940, MRN [id number]  PCP:  Ivan Anchors, MD  Cardiologist:   Minus Breeding, MD   Chief Complaint  Patient presents with  . Atrial Fibrillation      History of Present Illness: Jacob Singleton is a 79 y.o. male who presents for evaluation of atrial fibrillation.  He failed to convert to NSR with DCCV.  Since I last saw him he has done well.  He is not exercising as much as I would like because of the pandemic.  However, he still stays very active. The patient denies any new symptoms such as chest discomfort, neck or arm discomfort. There has been no new shortness of breath, PND or orthopnea. There have been no reported palpitations, presyncope or syncope.    Past Medical History:  Diagnosis Date  . A-fib ([place])   . Hypertension     Past Surgical History:  Procedure Laterality Date  . CARDIOVERSION N/A 02/11/2016   Procedure: CARDIOVERSION;  Surgeon: Josue Hector, MD;  Location: [facility] ENDOSCOPY;  Service: Cardiovascular;  Laterality: N/A;  . NONE       Current Outpatient Medications  Medication Sig Dispense Refill  . apixaban (ELIQUIS) 5 MG TABS tablet Take 5 mg by mouth 2 (two) times daily.    . Coenzyme Q10 (COQ10 PO) Take 1 tablet by mouth daily.    . hydrochlorothiazide (HYDRODIURIL) 25 MG tablet Take 25 mg by mouth daily.    Marland Kitchen latanoprost (XALATAN) 0.005 % ophthalmic solution Place 1 drop into both eyes at bedtime.    . Multiple Vitamin (MULTIVITAMIN WITH MINERALS) TABS tablet Take 1 tablet by mouth daily.    Marland Kitchen POTASSIUM PO Take by mouth.    . TURMERIC PO Take 1 tablet by mouth daily.     No current facility-administered medications for this visit.    Allergies:   Codeine    ROS:  Please see the history of present illness.   Otherwise, review of systems are positive for none.   All other systems are reviewed and negative.    PHYSICAL EXAM: VS:  BP 136/78   Pulse (!) 109   Ht  5\' 11"  (1.803 m)   Wt 160 lb (72.6 kg)   SpO2 99%   BMI 22.32 kg/m  , BMI Body mass index is 22.32 kg/m.  GENERAL:  Well appearing NECK:  No jugular venous distention, waveform within normal limits, carotid upstroke brisk and symmetric, no bruits, no thyromegaly LUNGS:  Clear to auscultation bilaterally CHEST:  Unremarkable HEART:  PMI not displaced or sustained,S1 and S2 within normal limits, no S3, noclicks, no rubs, mp murmurs, irregular  ABD:  Flat, positive bowel sounds normal in frequency in pitch, no bruits, no rebound, no guarding, no midline pulsatile mass, no hepatomegaly, no splenomegaly EXT:  2 plus pulses throughout, no edema, no cyanosis no clubbing   EKG:  EKG is ordered today. Atrial fibrillation, rate 109, axis within normal limits, intervals within normal limits with premature ventricular contractions.   Recent Labs: No results found for requested labs within last 8760 hours.    Lipid Panel No results found for: CHOL, TRIG, HDL, CHOLHDL, VLDL, LDLCALC, LDLDIRECT    Wt Readings from Last 3 Encounters:  02/11/20 160 lb (72.6 kg)  02/11/19 160 lb 6.4 oz (72.8 kg)  10/31/17 159 lb 12.8 oz (72.5 kg)      Other studies Reviewed: Additional studies/  records that were reviewed today include: Labs Review of the above records demonstrates:  See elsewhere  ASSESSMENT AND PLAN:  ATRIAL FIB:    Jacob Singleton has a CHA2DS2 - VASc score of 3.  He tolerates anticoagulation.  He has no symptoms. His heart rate was somewhat elevated today. Of asked him to get a pulse oximeter and keep track of this. I probably will titrate his medications if his rate is consistently elevated but he doesn't think this is the case.  HTN:  BP is controlled.  No change in therapy.    AAA:   This is followed at the [place].   COVID EDUCATION: He has not wanted to be vaccinated but we had a long discussion about this and he will reconsider.  DECREASED EF:   This was normal on echo in May  of 2019.  No further testing  Current medicines are reviewed at length with the patient today.  The patient has no concerns regarding medicines.  The following changes have been made: None  Labs/ tests ordered today include:  None  Orders Placed This Encounter  Procedures  . EKG 12-Lead     Disposition:   FU with me in 12 months.    Signed, Minus Breeding, MD  02/11/2020 2:12 PM    [facility]

## 2020-02-11 ENCOUNTER — Other Ambulatory Visit: Payer: Self-pay

## 2020-02-11 ENCOUNTER — Encounter: Payer: Self-pay | Admitting: Cardiology

## 2020-02-11 ENCOUNTER — Ambulatory Visit (INDEPENDENT_AMBULATORY_CARE_PROVIDER_SITE_OTHER): Payer: Medicare Other | Admitting: Cardiology

## 2020-02-11 VITALS — BP 136/78 | HR 109 | Ht 71.0 in | Wt 160.0 lb

## 2020-02-11 DIAGNOSIS — I714 Abdominal aortic aneurysm, without rupture, unspecified: Secondary | ICD-10-CM

## 2020-02-11 DIAGNOSIS — I482 Chronic atrial fibrillation, unspecified: Secondary | ICD-10-CM | POA: Diagnosis not present

## 2020-02-11 DIAGNOSIS — Z7189 Other specified counseling: Secondary | ICD-10-CM | POA: Diagnosis not present

## 2020-02-11 DIAGNOSIS — I10 Essential (primary) hypertension: Secondary | ICD-10-CM | POA: Diagnosis not present

## 2020-02-11 NOTE — Patient Instructions (Signed)

## 2021-02-17 NOTE — Progress Notes (Addendum)
Cardiology Office Note   Date:  02/18/2021   ID:  Jacob Singleton, DOB [birth year], MRN [id number]  PCP:  Ivan Anchors, MD  Cardiologist:   Minus Breeding, MD   Chief Complaint  Patient presents with   Atrial Fibrillation       History of Present Illness: Jacob Singleton is a 80 y.o. male who presents for evaluation of atrial fibrillation.  He failed to convert to NSR with DCCV.  Since I last saw him he has done well.  He lives by himself as his wife died about 4 years ago.  He has not been doing as much exercise as he was because he has not been back to the gym but he does yard work and pushes a Therapist, music. The patient denies any new symptoms such as chest discomfort, neck or arm discomfort. There has been no new shortness of breath, PND or orthopnea. There have been no reported palpitations, presyncope or syncope.    Past Medical History:  Diagnosis Date   A-fib [facility])    Hypertension     Past Surgical History:  Procedure Laterality Date   CARDIOVERSION N/A 02/11/2016   Procedure: CARDIOVERSION;  Surgeon: Josue Hector, MD;  Location: [facility] ENDOSCOPY;  Service: Cardiovascular;  Laterality: N/A;   NONE       Current Outpatient Medications  Medication Sig Dispense Refill   apixaban (ELIQUIS) 5 MG TABS tablet Take 5 mg by mouth 2 (two) times daily.     Coenzyme Q10 (COQ10 PO) Take 1 tablet by mouth daily.     hydrochlorothiazide (HYDRODIURIL) 25 MG tablet Take 25 mg by mouth daily.     latanoprost (XALATAN) 0.005 % ophthalmic solution Place 1 drop into both eyes at bedtime.     Multiple Vitamin (MULTIVITAMIN WITH MINERALS) TABS tablet Take 1 tablet by mouth daily.     POTASSIUM PO Take by mouth.     TURMERIC PO Take 1 tablet by mouth daily.     No current facility-administered medications for this visit.    Allergies:   Codeine    ROS:  Please see the history of present illness.   Otherwise, review of systems are positive for none.   All other systems are reviewed and  negative.    PHYSICAL EXAM: VS:  BP 130/70 (BP Location: Right Arm)   Pulse 94   Ht '5\' 11"'$  (1.803 m)   Wt 157 lb 3.2 oz (71.3 kg)   SpO2 99%   BMI 21.92 kg/m  , BMI Body mass index is 21.92 kg/m.  GENERAL:  Well appearing NECK:  No jugular venous distention, waveform within normal limits, carotid upstroke brisk and symmetric, no bruits, no thyromegaly LUNGS:  Clear to auscultation bilaterally CHEST:  Unremarkable HEART:  PMI not displaced or sustained,S1 and S2 within normal limits, no S3, no clicks, no rubs, no murmurs, irregular  ABD:  Flat, positive bowel sounds normal in frequency in pitch, no bruits, no rebound, no guarding, positive pulsatile mass, no hepatomegaly, no splenomegaly EXT:  2 plus pulses throughout, no edema, no cyanosis no clubbing   EKG:  EKG is  ordered today. Atrial fibrillation, rate 94, axis within normal limits, intervals within normal limits with premature ventricular contractions.   Recent Labs: No results found for requested labs within last 8760 hours.    Lipid Panel No results found for: CHOL, TRIG, HDL, CHOLHDL, VLDL, LDLCALC, LDLDIRECT    Wt Readings from Last 3 Encounters:  02/18/21 157 lb  3.2 oz (71.3 kg)  02/11/20 160 lb (72.6 kg)  02/11/19 160 lb 6.4 oz (72.8 kg)      Other studies Reviewed: Additional studies/ records that were reviewed today include: None Review of the above records demonstrates:    ASSESSMENT AND PLAN:  ATRIAL FIB:    Jacob Singleton has a CHA2DS2 - VASc score of 3.  He is going to check with the [facility] and get me his blood work to make sure his renal function and CBC have been checked and up-to-date.  Otherwise no change in therapy.  He is going to keep an eye on his heart rate with a pulse oximeter.  HTN:    The BP is at target.  No change in therapy.  BP is controlled.  No change in therapy.    AAA:   I am going to refer him to Dr. Trula Slade as he would like to start having this followed locally.   Current  medicines are reviewed at length with the patient today.  The patient has no concerns regarding medicines.  The following changes have been made: None  Labs/ tests ordered today include:   None  Orders Placed This Encounter  Procedures   Ambulatory referral to Vascular Surgery   EKG 12-Lead      Disposition:   FU with me in 12  months.    Signed, Minus Breeding, MD  02/18/2021 11:19 AM    [place]

## 2021-02-18 ENCOUNTER — Other Ambulatory Visit: Payer: Self-pay

## 2021-02-18 ENCOUNTER — Ambulatory Visit (INDEPENDENT_AMBULATORY_CARE_PROVIDER_SITE_OTHER): Payer: Medicare Other | Admitting: Cardiology

## 2021-02-18 ENCOUNTER — Encounter: Payer: Self-pay | Admitting: Cardiology

## 2021-02-18 VITALS — BP 130/70 | HR 94 | Ht 71.0 in | Wt 157.2 lb

## 2021-02-18 DIAGNOSIS — I10 Essential (primary) hypertension: Secondary | ICD-10-CM

## 2021-02-18 DIAGNOSIS — I714 Abdominal aortic aneurysm, without rupture, unspecified: Secondary | ICD-10-CM

## 2021-02-18 DIAGNOSIS — I482 Chronic atrial fibrillation, unspecified: Secondary | ICD-10-CM | POA: Diagnosis not present

## 2021-02-18 NOTE — Patient Instructions (Signed)
Medication Instructions:  No Changes In Medications at this time.  *If you need a refill on your cardiac medications before your next appointment, please call your pharmacy*  Follow-Up: At [facility], you and your health needs are our priority.  As part of our continuing mission to provide you with exceptional heart care, we have created designated Provider Care Teams.  These Care Teams include your primary Cardiologist (physician) and Advanced Practice Providers (APPs -  Physician Assistants and Nurse Practitioners) who all work together to provide you with the care you need, when you need it.  We recommend signing up for the patient portal called "MyChart".  Sign up information is provided on this After Visit Summary.  MyChart is used to connect with patients for Virtual Visits (Telemedicine).  Patients are able to view lab/test results, encounter notes, upcoming appointments, etc.  Non-urgent messages can be sent to your provider as well.   To learn more about what you can do with MyChart, go to [email or web address].    Your next appointment:   1 year(s)  The format for your next appointment:   In Person  Provider:   Minus Breeding, MD  Other Instructions [place] (Dr. Trula Slade) SOMEONE [place]

## 2021-03-12 ENCOUNTER — Other Ambulatory Visit: Payer: Self-pay | Admitting: *Deleted

## 2021-03-12 DIAGNOSIS — I714 Abdominal aortic aneurysm, without rupture, unspecified: Secondary | ICD-10-CM

## 2021-03-22 ENCOUNTER — Other Ambulatory Visit: Payer: Self-pay

## 2021-03-22 ENCOUNTER — Ambulatory Visit (HOSPITAL_COMMUNITY)
Admission: RE | Admit: 2021-03-22 | Discharge: 2021-03-22 | Disposition: A | Discharge status: Home / Self Care | Payer: Medicare Other | Source: Ambulatory Visit | Attending: Surgery | Admitting: Surgery

## 2021-03-22 ENCOUNTER — Ambulatory Visit (INDEPENDENT_AMBULATORY_CARE_PROVIDER_SITE_OTHER): Payer: Medicare Other | Admitting: Surgery

## 2021-03-22 ENCOUNTER — Encounter: Payer: Self-pay | Admitting: Surgery

## 2021-03-22 ENCOUNTER — Encounter: Payer: Medicare Other | Admitting: Surgery

## 2021-03-22 VITALS — BP 132/90 | HR 65 | Temp 98.1°F | Resp 20 | Ht 71.0 in | Wt 153.0 lb

## 2021-03-22 DIAGNOSIS — I714 Abdominal aortic aneurysm, without rupture, unspecified: Secondary | ICD-10-CM

## 2021-03-22 NOTE — Progress Notes (Signed)
Vascular and Vein Specialist of [facility]  Patient name: Jacob Singleton MRN: [id number] DOB: 06-13-1941 Sex: male   REQUESTING PROVIDER:    Dr. Percival Spanish   REASON FOR CONSULT:    AAA  HISTORY OF PRESENT ILLNESS:   Jacob Singleton is a 80 y.o. male, who is referred for evaluation of an abdominal aortic aneurysm.  This is been followed at the [place].  His most recent ultrasound from several months ago showed a 4.9 cm aneurysm.  He denies any abdominal pain or back pain.  He is concerned about his aneurysm as he had a brother-in-law die from a ruptured him.  The patient is anticoagulated for atrial fibrillation.  He is medically managed for hypertension.  He is a former smoker  PAST MEDICAL HISTORY    Past Medical History:  Diagnosis Date   A-fib ([place])    Hypertension      FAMILY HISTORY   Family History  Problem Relation Age of Onset   Colon cancer Mother    Rheum arthritis Sister        OLDER SISTER    SOCIAL HISTORY:   Social History   Socioeconomic History   Marital status: Widowed    Spouse name: SANDRA   Number of children: 3   Years of education: COLLEGE   Highest education level: Not on file  Occupational History   Occupation: RETIRED  Tobacco Use   Smoking status: Former    Types: Cigarettes    Quit date: 01/01/2012    Years since quitting: 9.2   Smokeless tobacco: Never  Substance and Sexual Activity   Alcohol use: Yes    Alcohol/week: 0.0 standard drinks   Drug use: No   Sexual activity: Not on file  Other Topics Concern   Not on file  Social History Narrative   Lives alone  .     Social Determinants of Health   Financial Resource Strain: Not on file  Food Insecurity: Not on file  Transportation Needs: Not on file  Physical Activity: Not on file  Stress: Not on file  Social Connections: Not on file  Intimate Partner Violence: Not on file    ALLERGIES:    Allergies  Allergen Reactions   Codeine  Other (See Comments)    PASSED OUT     CURRENT MEDICATIONS:    Current Outpatient Medications  Medication Sig Dispense Refill   apixaban (ELIQUIS) 5 MG TABS tablet Take 5 mg by mouth 2 (two) times daily.     Coenzyme Q10 (COQ10 PO) Take 1 tablet by mouth daily.     hydrochlorothiazide (HYDRODIURIL) 25 MG tablet Take 25 mg by mouth daily.     latanoprost (XALATAN) 0.005 % ophthalmic solution Place 1 drop into both eyes at bedtime.     Multiple Vitamin (MULTIVITAMIN WITH MINERALS) TABS tablet Take 1 tablet by mouth daily.     POTASSIUM PO Take by mouth.     TURMERIC PO Take 1 tablet by mouth daily.     No current facility-administered medications for this visit.    REVIEW OF SYSTEMS:   [X]  denotes positive finding, [ ]  denotes negative finding Cardiac  Comments:  Chest pain or chest pressure:    Shortness of breath upon exertion:    Short of breath when lying flat:    Irregular heart rhythm:        Vascular    Pain in calf, thigh, or hip brought on by ambulation:    Pain in feet  at night that wakes you up from your sleep:     Blood clot in your veins:    Leg swelling:         Pulmonary    Oxygen at home:    Productive cough:     Wheezing:         Neurologic    Sudden weakness in arms or legs:     Sudden numbness in arms or legs:     Sudden onset of difficulty speaking or slurred speech:    Temporary loss of vision in one eye:     Problems with dizziness:         Gastrointestinal    Blood in stool:      Vomited blood:         Genitourinary    Burning when urinating:     Blood in urine:        Psychiatric    Major depression:         Hematologic    Bleeding problems:    Problems with blood clotting too easily:        Skin    Rashes or ulcers:        Constitutional    Fever or chills:     PHYSICAL EXAM:   There were no vitals filed for this visit.  GENERAL: The patient is a well-nourished male, in no acute distress. The vital signs are documented  above. CARDIAC: There is a regular rate and rhythm.  VASCULAR: Palpable dorsalis pedis on the left and posterior tibial on the right PULMONARY: Nonlabored respirations ABDOMEN: Soft and non-tender aneurysm is easily palpable and nontender.  MUSCULOSKELETAL: There are no major deformities or cyanosis. NEUROLOGIC: No focal weakness or paresthesias are detected. SKIN: There are no ulcers or rashes noted. PSYCHIATRIC: The patient has a normal affect.  STUDIES:   I have reviewed his abdominal ultrasound.  Maximum aortic diameter is 5.09 cm  ASSESSMENT and PLAN   AAA: I discussed with patient that based on his ultrasound findings today, he meets criteria to have his aneurysm repaired.  I need to get a CT angiogram to define his anatomy, and to confirm the aortic diameter.  This will be performed in the immediate future and he will follow-up after the CT scan.  I will also get baseline carotid Doppler studies and ABIs, as well as a lower extremity duplex to check for popliteal aneurysm.  I will have him get the studies and see me back in 1 to 2 weeks  Cholesterol: The patient tells me his cholesterol has been well managed, however now with his aneurysm, I would like for him to be on a statin.  I will start this while he is in the hospital, probably Crestor 5 or 10 mg   Leia Alf, MD, FACS Vascular and Vein Specialists of [facility] [phone] Pager [phone]

## 2021-03-23 ENCOUNTER — Other Ambulatory Visit: Payer: Self-pay

## 2021-03-23 DIAGNOSIS — I714 Abdominal aortic aneurysm, without rupture, unspecified: Secondary | ICD-10-CM

## 2021-03-29 ENCOUNTER — Ambulatory Visit: Payer: Medicare Other

## 2021-03-29 ENCOUNTER — Ambulatory Visit (INDEPENDENT_AMBULATORY_CARE_PROVIDER_SITE_OTHER): Payer: Medicare Other

## 2021-03-29 ENCOUNTER — Other Ambulatory Visit: Payer: Self-pay

## 2021-03-29 DIAGNOSIS — I714 Abdominal aortic aneurysm, without rupture, unspecified: Secondary | ICD-10-CM

## 2021-03-29 LAB — I-STAT CREATININE (MANUAL ENTRY): Creatinine, Ser: 1 (ref 0.50–1.10)

## 2021-03-29 MED ORDER — IOHEXOL 350 MG/ML SOLN
100.0000 mL | Freq: Once | INTRAVENOUS | Status: AC | PRN
  Administered 2021-03-29: 100 mL via INTRAVENOUS

## 2021-04-08 ENCOUNTER — Other Ambulatory Visit: Payer: Self-pay

## 2021-04-08 ENCOUNTER — Ambulatory Visit (INDEPENDENT_AMBULATORY_CARE_PROVIDER_SITE_OTHER)
Admission: RE | Admit: 2021-04-08 | Discharge: 2021-04-08 | Disposition: A | Discharge status: Home / Self Care | Payer: Medicare Other | Source: Ambulatory Visit | Attending: Surgery | Admitting: Surgery

## 2021-04-08 ENCOUNTER — Ambulatory Visit (HOSPITAL_COMMUNITY)
Admission: RE | Admit: 2021-04-08 | Discharge: 2021-04-08 | Disposition: A | Discharge status: Home / Self Care | Payer: Medicare Other | Source: Ambulatory Visit | Attending: Surgery | Admitting: Surgery

## 2021-04-08 DIAGNOSIS — I714 Abdominal aortic aneurysm, without rupture, unspecified: Secondary | ICD-10-CM

## 2021-04-12 ENCOUNTER — Ambulatory Visit: Payer: Medicare Other | Admitting: Surgery

## 2021-04-26 ENCOUNTER — Other Ambulatory Visit: Payer: Self-pay

## 2021-04-26 ENCOUNTER — Ambulatory Visit (INDEPENDENT_AMBULATORY_CARE_PROVIDER_SITE_OTHER): Payer: Medicare Other | Admitting: Surgery

## 2021-04-26 ENCOUNTER — Encounter: Payer: Self-pay | Admitting: Surgery

## 2021-04-26 VITALS — BP 142/92 | HR 96 | Temp 98.6°F | Resp 20 | Ht 71.0 in | Wt 154.0 lb

## 2021-04-26 DIAGNOSIS — I7143 Infrarenal abdominal aortic aneurysm, without rupture: Secondary | ICD-10-CM

## 2021-04-26 NOTE — Progress Notes (Signed)
Vascular and Vein Specialist of [place]  Patient name: Jacob Singleton MRN: [id number] DOB: 1940/12/23 Sex: male   REASON FOR VISIT:    Follow up  HISOTRY OF PRESENT ILLNESS:    HOMMER CUNLIFFE is a 80 y.o. male that I saw a few weeks ago for evaluation of an abdominal aortic aneurysm that has been followed by the [place].  He had a ultrasound several months ago that showed a 4.9 cm aneurysm.  I felt this needed to be better evaluated with CT scan.  He is back today to discuss these results.  He remains without abdominal pain or back pain.  He does have a brother-in-law who has died from a ruptured aneurysm.  Patient is anticoagulated for atrial fibrillation.  He is medically managed for hypertension.  He is a former smoker.   PAST MEDICAL HISTORY:   Past Medical History:  Diagnosis Date   A-fib ([place])    Hypertension      FAMILY HISTORY:   Family History  Problem Relation Age of Onset   Colon cancer Mother    Rheum arthritis Sister        OLDER SISTER    SOCIAL HISTORY:   Social History   Tobacco Use   Smoking status: Former    Types: Cigarettes    Quit date: 01/01/2012    Years since quitting: 9.3   Smokeless tobacco: Never  Substance Use Topics   Alcohol use: Yes    Alcohol/week: 0.0 standard drinks     ALLERGIES:   Allergies  Allergen Reactions   Codeine Other (See Comments)    PASSED OUT      CURRENT MEDICATIONS:   Current Outpatient Medications  Medication Sig Dispense Refill   apixaban (ELIQUIS) 5 MG TABS tablet Take 5 mg by mouth 2 (two) times daily.     Coenzyme Q10 (COQ10 PO) Take 1 tablet by mouth daily.     hydrochlorothiazide (HYDRODIURIL) 25 MG tablet Take 25 mg by mouth daily.     latanoprost (XALATAN) 0.005 % ophthalmic solution Place 1 drop into both eyes at bedtime.     Multiple Vitamin (MULTIVITAMIN WITH MINERALS) TABS tablet Take 1 tablet by mouth daily.     POTASSIUM PO Take by mouth.      TURMERIC PO Take 1 tablet by mouth daily.     No current facility-administered medications for this visit.    REVIEW OF SYSTEMS:   [X]  denotes positive finding, [ ]  denotes negative finding Cardiac  Comments:  Chest pain or chest pressure:    Shortness of breath upon exertion:    Short of breath when lying flat:    Irregular heart rhythm:        Vascular    Pain in calf, thigh, or hip brought on by ambulation:    Pain in feet at night that wakes you up from your sleep:     Blood clot in your veins:    Leg swelling:         Pulmonary    Oxygen at home:    Productive cough:     Wheezing:         Neurologic    Sudden weakness in arms or legs:     Sudden numbness in arms or legs:     Sudden onset of difficulty speaking or slurred speech:    Temporary loss of vision in one eye:     Problems with dizziness:  Gastrointestinal    Blood in stool:     Vomited blood:         Genitourinary    Burning when urinating:     Blood in urine:        Psychiatric    Major depression:         Hematologic    Bleeding problems:    Problems with blood clotting too easily:        Skin    Rashes or ulcers:        Constitutional    Fever or chills:      PHYSICAL EXAM:   Vitals:   04/26/21 1454  BP: (!) 142/92  Pulse: 96  Resp: 20  Temp: 98.6 F (37 C)  SpO2: 98%  Weight: 154 lb (69.9 kg)  Height: 5\' 11"  (1.803 m)    GENERAL: The patient is a well-nourished male, in no acute distress.  PULMONARY: Non-labored respirations ABDOMEN: Soft and non-tender.  MUSCULOSKELETAL: There are no major deformities or cyanosis. NEUROLOGIC: No focal weakness or paresthesias are detected. SKIN: There are no ulcers or rashes noted. PSYCHIATRIC: The patient has a normal affect.  STUDIES:   I have reviewed his ultrasound studies with the following findings: Carotid: Right Carotid: There is no evidence of stenosis in the right ICA.   Left Carotid: Velocities in the left ICA are  consistent with a 1-39%  stenosis.   Vertebrals:  Bilateral vertebral arteries demonstrate antegrade flow.  Subclavians: Normal flow hemodynamics were seen in bilateral subclavian               arteries   ABIs are in the normal range.  No evidence of popliteal aneurysm.  CT angiogram: 1. 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 Recommend referral to a Vascular Specialist if not already obtained. This recommendation follows ACR consensus guidelines: White Paper of the ACR Incidental Findings Committee II on Vascular Findings. J Am Coll Radiol 2013; 10:789-794. 2. Coronary and aortic atherosclerosis (ICD10-I70.0).   NON VASCULAR   1. No acute abnormality in the chest, abdomen, or pelvis. 2. Evidence of prior granulomatous disease.   MEDICAL ISSUES:   AAA: Maximum aortic diameter is 5.5 cm.  We discussed proceeding with endovascular repair to prevent rupture.  This will be from a bilateral percutaneous approach.  We discussed the details of the operation as well as the risks and benefits including but not limited to the risk of bleeding, renal insufficiency, stroke, death, endoleak's requiring future interventions.  All of his questions were answered.  I Georgina Peer try to get this done before Thanksgiving.  He will need to be off of his Eliquis prior to surgery.  I will plan on starting a statin in the hospital    Leia Alf, MD, FACS Vascular and Vein Specialists of [facility] [phone] Pager [phone]

## 2021-04-26 NOTE — H&P (View-Only) (Signed)
Vascular and Vein Specialist of [place]  Patient name: Jacob Singleton MRN: [id number] DOB: 1940/10/22 Sex: male   REASON FOR VISIT:    Follow up  HISOTRY OF PRESENT ILLNESS:    Jacob Singleton is a 80 y.o. male that I saw a few weeks ago for evaluation of an abdominal aortic aneurysm that has been followed by the [place].  He had a ultrasound several months ago that showed a 4.9 cm aneurysm.  I felt this needed to be better evaluated with CT scan.  He is back today to discuss these results.  He remains without abdominal pain or back pain.  He does have a brother-in-law who has died from a ruptured aneurysm.  Patient is anticoagulated for atrial fibrillation.  He is medically managed for hypertension.  He is a former smoker.   PAST MEDICAL HISTORY:   Past Medical History:  Diagnosis Date   A-fib ([place])    Hypertension      FAMILY HISTORY:   Family History  Problem Relation Age of Onset   Colon cancer Mother    Rheum arthritis Sister        OLDER SISTER    SOCIAL HISTORY:   Social History   Tobacco Use   Smoking status: Former    Types: Cigarettes    Quit date: 01/01/2012    Years since quitting: 9.3   Smokeless tobacco: Never  Substance Use Topics   Alcohol use: Yes    Alcohol/week: 0.0 standard drinks     ALLERGIES:   Allergies  Allergen Reactions   Codeine Other (See Comments)    PASSED OUT      CURRENT MEDICATIONS:   Current Outpatient Medications  Medication Sig Dispense Refill   apixaban (ELIQUIS) 5 MG TABS tablet Take 5 mg by mouth 2 (two) times daily.     Coenzyme Q10 (COQ10 PO) Take 1 tablet by mouth daily.     hydrochlorothiazide (HYDRODIURIL) 25 MG tablet Take 25 mg by mouth daily.     latanoprost (XALATAN) 0.005 % ophthalmic solution Place 1 drop into both eyes at bedtime.     Multiple Vitamin (MULTIVITAMIN WITH MINERALS) TABS tablet Take 1 tablet by mouth daily.     POTASSIUM PO Take by mouth.      TURMERIC PO Take 1 tablet by mouth daily.     No current facility-administered medications for this visit.    REVIEW OF SYSTEMS:   [X]  denotes positive finding, [ ]  denotes negative finding Cardiac  Comments:  Chest pain or chest pressure:    Shortness of breath upon exertion:    Short of breath when lying flat:    Irregular heart rhythm:        Vascular    Pain in calf, thigh, or hip brought on by ambulation:    Pain in feet at night that wakes you up from your sleep:     Blood clot in your veins:    Leg swelling:         Pulmonary    Oxygen at home:    Productive cough:     Wheezing:         Neurologic    Sudden weakness in arms or legs:     Sudden numbness in arms or legs:     Sudden onset of difficulty speaking or slurred speech:    Temporary loss of vision in one eye:     Problems with dizziness:  Gastrointestinal    Blood in stool:     Vomited blood:         Genitourinary    Burning when urinating:     Blood in urine:        Psychiatric    Major depression:         Hematologic    Bleeding problems:    Problems with blood clotting too easily:        Skin    Rashes or ulcers:        Constitutional    Fever or chills:      PHYSICAL EXAM:   Vitals:   04/26/21 1454  BP: (!) 142/92  Pulse: 96  Resp: 20  Temp: 98.6 F (37 C)  SpO2: 98%  Weight: 154 lb (69.9 kg)  Height: 5\' 11"  (1.803 m)    GENERAL: The patient is a well-nourished male, in no acute distress.  PULMONARY: Non-labored respirations ABDOMEN: Soft and non-tender.  MUSCULOSKELETAL: There are no major deformities or cyanosis. NEUROLOGIC: No focal weakness or paresthesias are detected. SKIN: There are no ulcers or rashes noted. PSYCHIATRIC: The patient has a normal affect.  STUDIES:   I have reviewed his ultrasound studies with the following findings: Carotid: Right Carotid: There is no evidence of stenosis in the right ICA.   Left Carotid: Velocities in the left ICA are  consistent with a 1-39%  stenosis.   Vertebrals:  Bilateral vertebral arteries demonstrate antegrade flow.  Subclavians: Normal flow hemodynamics were seen in bilateral subclavian               arteries   ABIs are in the normal range.  No evidence of popliteal aneurysm.  CT angiogram: 1. 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 Recommend referral to a Vascular Specialist if not already obtained. This recommendation follows ACR consensus guidelines: White Paper of the ACR Incidental Findings Committee II on Vascular Findings. J Am Coll Radiol 2013; 10:789-794. 2. Coronary and aortic atherosclerosis (ICD10-I70.0).   NON VASCULAR   1. No acute abnormality in the chest, abdomen, or pelvis. 2. Evidence of prior granulomatous disease.   MEDICAL ISSUES:   AAA: Maximum aortic diameter is 5.5 cm.  We discussed proceeding with endovascular repair to prevent rupture.  This will be from a bilateral percutaneous approach.  We discussed the details of the operation as well as the risks and benefits including but not limited to the risk of bleeding, renal insufficiency, stroke, death, endoleak's requiring future interventions.  All of his questions were answered.  I Georgina Peer try to get this done before Thanksgiving.  He will need to be off of his Eliquis prior to surgery.  I will plan on starting a statin in the hospital    Leia Alf, MD, FACS Vascular and Vein Specialists of [facility] [phone] Pager [phone]

## 2021-04-27 ENCOUNTER — Other Ambulatory Visit: Payer: Self-pay

## 2021-04-27 DIAGNOSIS — Z419 Encounter for procedure for purposes other than remedying health state, unspecified: Secondary | ICD-10-CM

## 2021-04-27 DIAGNOSIS — I7143 Infrarenal abdominal aortic aneurysm, without rupture: Secondary | ICD-10-CM

## 2021-04-28 ENCOUNTER — Telehealth: Payer: Self-pay

## 2021-04-28 NOTE — Telephone Encounter (Signed)
   [facility] Pre-operative Risk Assessment    Patient Name: Jacob Singleton  DOB: 1941/01/25 MRN: [id number]  HEARTCARE STAFF:  - IMPORTANT!!!!!! Under Visit Info/Reason for Call, type in Other and utilize the format Clearance MM/DD/YY or Clearance TBD. Do not use dashes or single digits. - Please review there is not already an duplicate clearance open for this procedure. - If request is for dental extraction, please clarify the # of teeth to be extracted. - If the patient is currently at the dentist's office, call Pre-Op Callback Staff (MA/nurse) to input urgent request.  - If the patient is not currently in the dentist office, please route to the Pre-Op pool.  Request for surgical clearance:  What type of surgery is being performed? Abdominal Endovascular Aortic Stent Graft Repair   When is this surgery scheduled? 05/13/21  What type of clearance is required (medical clearance vs. Pharmacy clearance to hold med vs. Both)? Both   Are there any medications that need to be held prior to surgery and how long? Eliquis hold for 3 days prior  Practice name and name of physician performing surgery? [facility], Dr. Annamarie Major   What is the office phone number? [phone]   7.   What is the office fax number? [phone]  8.   Anesthesia type (None, local, MAC, general) ? Not listed    Jacqulynn Cadet 04/28/2021, 1:10 PM  _________________________________________________________________   (provider comments below)

## 2021-04-28 NOTE — Telephone Encounter (Signed)
   Name: Jacob Singleton  DOB: [birth year]  MRN: [id number]   Primary Cardiologist: Minus Breeding, MD  Chart reviewed as part of pre-operative protocol coverage. Patient was contacted 04/28/2021 in reference to pre-operative risk assessment for pending surgery as outlined below.  VANDERBILT RANIERI was last seen on 02/18/21 by Dr. Percival Spanish.  Since that day, SHERREL SHAFER has done well.  He can complete more than 4.0 METS  without angina.   Per our clinical pharmacist: Per office protocol, patient can hold Eliquis for 3 days prior to procedure.  Therefore, based on ACC/AHA guidelines, the patient would be at acceptable risk for the planned procedure without further cardiovascular testing.   The patient was advised that if he develops new symptoms prior to surgery to contact our office to arrange for a follow-up visit, and he verbalized understanding.  I will route this recommendation to the requesting party via Epic fax function and remove from pre-op pool. Please call with questions.  Tami Lin Zandra Lajeunesse, PA 04/28/2021, 3:59 PM

## 2021-04-28 NOTE — Telephone Encounter (Signed)
Patient with diagnosis of afib on Eliquis for anticoagulation.    Procedure:  Abdominal Endovascular Aortic Stent Graft Repair  Date of procedure: 05/13/21  CHA2DS2-VASc Score = 3   This indicates a 3.2% annual risk of stroke. The patient's score is based upon: CHF History: 0 HTN History: 1 Diabetes History: 0 Stroke History: 0 Vascular Disease History: 0 Age Score: 2 Gender Score: 0     CrCl 59 ml/min  Per office protocol, patient can hold Eliquis for 3 days prior to procedure.

## 2021-05-07 NOTE — Progress Notes (Signed)
Surgical Instructions    Your procedure is scheduled on Thursday, November 17th.  Report to [facility] Main Entrance "A" at 5:30 A.M., then check in with the Admitting office.  Call this number if you have problems the morning of surgery:  [phone]   If you have any questions prior to your surgery date call [phone]: Open Monday-Friday 8am-4pm    Remember:  Do not eat or drink after midnight the night before your surgery     Take these medicines the morning of surgery   Eye drops - if needed   Follow your surgeon's instructions on when to stop Eliquis.  If no instructions were given by your surgeon then you will need to call the office to get those instructions.    As of today, STOP taking any Aspirin (unless otherwise instructed by your surgeon) Aleve, Naproxen, Ibuprofen, Motrin, Advil, Goody's, BC's, all herbal medications, fish oil, and all vitamins.  DAY OF SURGERY         Do not wear jewelry  Do not wear lotions, powders, colognes, or deodorant. Men may shave face and neck. Do not bring valuables to the hospital.             [facility] is not responsible for any belongings or valuables.  Do NOT Smoke (Tobacco/Vaping)  24 hours prior to your procedure  If you use a CPAP at night, you may bring your mask for your overnight stay.   Contacts, glasses, hearing aids, dentures or partials may not be worn into surgery, please bring cases for these belongings   For patients admitted to the hospital, discharge time will be determined by your treatment team.   Patients discharged the day of surgery will not be allowed to drive home, and someone needs to stay with them for 24 hours.  NO VISITORS WILL BE ALLOWED IN PRE-OP WHERE PATIENTS ARE PREPPED FOR SURGERY.  ONLY 1 SUPPORT PERSON MAY BE PRESENT IN THE WAITING ROOM WHILE YOU ARE IN SURGERY.  IF YOU ARE TO BE ADMITTED, ONCE YOU ARE IN YOUR ROOM YOU WILL BE ALLOWED TWO (2) VISITORS. 1 (ONE) VISITOR MAY STAY OVERNIGHT BUT  MUST ARRIVE TO THE ROOM BY 8pm.  Minor children may have two parents present. Special consideration for safety and communication needs will be reviewed on a case by case basis.  Special instructions:    Oral Hygiene is also important to reduce your risk of infection.  Remember - BRUSH YOUR TEETH THE MORNING OF SURGERY WITH YOUR REGULAR TOOTHPASTE   Jacob Singleton- Preparing For Surgery  Before surgery, you can play an important role. Because skin is not sterile, your skin needs to be as free of germs as possible. You can reduce the number of germs on your skin by washing with CHG (chlorahexidine gluconate) Soap before surgery.  CHG is an antiseptic cleaner which kills germs and bonds with the skin to continue killing germs even after washing.     Please do not use if you have an allergy to CHG or antibacterial soaps. If your skin becomes reddened/irritated stop using the CHG.  Do not shave (including legs and underarms) for at least 48 hours prior to first CHG shower. It is OK to shave your face.  Please follow these instructions carefully.     Shower the NIGHT BEFORE SURGERY and the MORNING OF SURGERY with CHG Soap.   If you chose to wash your hair, wash your hair first as usual with your normal shampoo. After  you shampoo, rinse your hair and body thoroughly to remove the shampoo.  Then [employer] and genitals (private parts) with your normal soap and rinse thoroughly to remove soap.  After that Use CHG Soap as you would any other liquid soap. You can apply CHG directly to the skin and wash gently with a scrungie or a clean washcloth.   Apply the CHG Soap to your body ONLY FROM THE NECK DOWN.  Do not use on open wounds or open sores. Avoid contact with your eyes, ears, mouth and genitals (private parts). Wash Face and genitals (private parts)  with your normal soap.   Wash thoroughly, paying special attention to the area where your surgery will be performed.  Thoroughly rinse your body with  warm water from the neck down.  DO NOT shower/wash with your normal soap after using and rinsing off the CHG Soap.  Pat yourself dry with a CLEAN TOWEL.  Wear CLEAN PAJAMAS to bed the night before surgery  Place CLEAN SHEETS on your bed the night before your surgery  DO NOT SLEEP WITH PETS.   Day of Surgery:  Take a shower with CHG soap. Wear Clean/Comfortable clothing the morning of surgery Do not apply any deodorants/lotions.   Remember to brush your teeth WITH YOUR REGULAR TOOTHPASTE.   Please read over the following fact sheets that you were given.

## 2021-05-10 ENCOUNTER — Other Ambulatory Visit: Payer: Self-pay

## 2021-05-10 ENCOUNTER — Encounter (HOSPITAL_COMMUNITY)
Admission: RE | Admit: 2021-05-10 | Discharge: 2021-05-10 | Disposition: A | Discharge status: Home / Self Care | Payer: Medicare Other | Source: Ambulatory Visit | Attending: Surgery | Admitting: Surgery

## 2021-05-10 ENCOUNTER — Encounter (HOSPITAL_COMMUNITY): Payer: Self-pay

## 2021-05-10 VITALS — BP 164/95 | HR 98 | Temp 97.7°F | Resp 17 | Ht 71.0 in | Wt 157.9 lb

## 2021-05-10 DIAGNOSIS — Z7901 Long term (current) use of anticoagulants: Secondary | ICD-10-CM | POA: Insufficient documentation

## 2021-05-10 DIAGNOSIS — Z419 Encounter for procedure for purposes other than remedying health state, unspecified: Secondary | ICD-10-CM

## 2021-05-10 DIAGNOSIS — Z20822 Contact with and (suspected) exposure to covid-19: Secondary | ICD-10-CM | POA: Insufficient documentation

## 2021-05-10 DIAGNOSIS — Z01818 Encounter for other preprocedural examination: Secondary | ICD-10-CM

## 2021-05-10 DIAGNOSIS — I7143 Infrarenal abdominal aortic aneurysm, without rupture: Secondary | ICD-10-CM

## 2021-05-10 DIAGNOSIS — Z01812 Encounter for preprocedural laboratory examination: Secondary | ICD-10-CM | POA: Insufficient documentation

## 2021-05-10 DIAGNOSIS — Z5181 Encounter for therapeutic drug level monitoring: Secondary | ICD-10-CM | POA: Insufficient documentation

## 2021-05-10 HISTORY — DX: Malignant (primary) neoplasm, unspecified: C80.1

## 2021-05-10 LAB — SARS CORONAVIRUS 2 (TAT 6-24 HRS): SARS Coronavirus 2: NEGATIVE

## 2021-05-10 LAB — COMPREHENSIVE METABOLIC PANEL
ALT: 20 U/L (ref 0–44)
AST: 22 U/L (ref 15–41)
Albumin: 3.6 g/dL (ref 3.5–5.0)
Alkaline Phosphatase: 119 U/L (ref 38–126)
Anion gap: 9 (ref 5–15)
BUN: 10 mg/dL (ref 8–23)
CO2: 26 mmol/L (ref 22–32)
Calcium: 9.7 mg/dL (ref 8.9–10.3)
Chloride: 100 mmol/L (ref 98–111)
Creatinine, Ser: 1 mg/dL (ref 0.61–1.24)
GFR, Estimated: >60 mL/min (ref >60)
Glucose, Bld: 110 mg/dL — ABNORMAL HIGH (ref 70–99)
Potassium: 4.5 mmol/L (ref 3.5–5.1)
Sodium: 135 mmol/L (ref 135–145)
Total Bilirubin: 0.6 mg/dL (ref 0.3–1.2)
Total Protein: 7 g/dL (ref 6.5–8.1)

## 2021-05-10 LAB — CBC
HCT: 45.9 % (ref 39.0–52.0)
Hemoglobin: 15.3 g/dL (ref 13.0–17.0)
MCH: 32.3 pg (ref 26.0–34.0)
MCHC: 33.3 g/dL (ref 30.0–36.0)
MCV: 96.8 fL (ref 80.0–100.0)
Platelets: 251 10*3/uL (ref 150–400)
RBC: 4.74 MIL/uL (ref 4.22–5.81)
RDW: 12.1 % (ref 11.5–15.5)
WBC: 7.4 10*3/uL (ref 4.0–10.5)
nRBC: 0 % (ref 0.0–0.2)

## 2021-05-10 LAB — SURGICAL PCR SCREEN
MRSA, PCR: NEGATIVE
Staphylococcus aureus: NEGATIVE

## 2021-05-10 LAB — TYPE AND SCREEN
ABO/RH(D): O POS
Antibody Screen: NEGATIVE

## 2021-05-10 LAB — URINALYSIS, ROUTINE W REFLEX MICROSCOPIC
Bilirubin Urine: NEGATIVE
Glucose, UA: NEGATIVE mg/dL
Hgb urine dipstick: NEGATIVE
Ketones, ur: NEGATIVE mg/dL
Leukocytes,Ua: NEGATIVE
Nitrite: NEGATIVE
Protein, ur: NEGATIVE mg/dL
Specific Gravity, Urine: 1.025 (ref 1.005–1.030)
pH: 6.5 (ref 5.0–8.0)

## 2021-05-10 LAB — PROTIME-INR
INR: 1.1 (ref 0.8–1.2)
Prothrombin Time: 14.2 seconds (ref 11.4–15.2)

## 2021-05-10 LAB — APTT: aPTT: 36 seconds (ref 24–36)

## 2021-05-10 NOTE — Progress Notes (Signed)
PCP - [facility] Dr. Ellouise Newer Cardiologist - Dr. Percival Spanish  PPM/ICD - n/a  Chest x-ray - n/a EKG - 02/18/21 Stress Test - pt denies ECHO - 11/14/2017 Cardiac Cath - pt denies  Sleep Study - pt denies  CPAP - n/a  Fasting Blood Sugar - n/a  Blood Thinner Instructions: Last dose of Eliquis: 05/10/21 per instruction of provider Aspirin Instructions: As of today, STOP taking any Aspirin (unless otherwise instructed by your surgeon) Aleve, Naproxen, Ibuprofen, Motrin, Advil, Goody's, BC's, all herbal medications, fish oil, and all vitamins.  NPO at midnight  COVID TEST- 05/10/21 in PAT  Anesthesia review: yes, cardiac hx; hx of a-fib  Patient denies shortness of breath, fever, cough and chest pain at PAT appointment   All instructions explained to the patient, with a verbal understanding of the material. Patient agrees to go over the instructions while at home for a better understanding. Patient also instructed to self quarantine after being tested for COVID-19. The opportunity to ask questions was provided.

## 2021-05-11 NOTE — Progress Notes (Signed)
Anesthesia Chart Review:  Follows with cardiology for hx of afib on Eliquis. Preop clearance per telephone encounter 04/28/21, "Chart reviewed as part of pre-operative protocol coverage. Patient was contacted 04/28/2021 in reference to pre-operative risk assessment for pending surgery as outlined below.  Jacob Singleton was last seen on 02/18/21 by Dr. Percival Spanish.  Since that day, Jacob Singleton has done well.  He can complete more than 4.0 METS  without angina. Per our clinical pharmacist: Per office protocol, patient can hold Eliquis for 3 days prior to procedure. Therefore, based on ACC/AHA guidelines, the patient would be at acceptable risk for the planned procedure without further cardiovascular testing."  Preop labs reviewed, unremarkable   EKG 02/18/21: Afib. Ventricular rate 94.  CT chest/abd/pelvis 03/29/21: IMPRESSION: VASCULAR   1. 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 Recommend referral to a Vascular Specialist if not already obtained. This recommendation follows ACR consensus guidelines: White Paper of the ACR Incidental Findings Committee II on Vascular Findings. J Am Coll Radiol 2013; 10:789-794. 2. Coronary and aortic atherosclerosis (ICD10-I70.0).   NON VASCULAR   1. No acute abnormality in the chest, abdomen, or pelvis. 2. Evidence of prior granulomatous disease.  TTE 11/14/17: - Left ventricle: The cavity size was normal. Wall thickness was    normal. Systolic function was normal. The estimated ejection    fraction was in the range of 60% to 65%. Wall motion was normal;    there were no regional wall motion abnormalities.  - Mitral valve: There was mild regurgitation.    Wynonia Musty [facility]/Anesthesiology Phone [phone] 05/11/2021 4:13 PM

## 2021-05-11 NOTE — Anesthesia Preprocedure Evaluation (Addendum)
Anesthesia Evaluation  Patient identified by MRN, date of birth, ID band Patient awake    Reviewed: Allergy & Precautions, H&P , NPO status , Patient's Chart, lab work & pertinent test results  Airway Mallampati: II  TM Distance: >3 FB Neck ROM: Full    Dental no notable dental hx.    Pulmonary neg pulmonary ROS, former smoker,    Pulmonary exam normal breath sounds clear to auscultation       Cardiovascular hypertension, Pt. on medications + Peripheral Vascular Disease  Normal cardiovascular exam Rhythm:Regular Rate:Normal     Neuro/Psych negative neurological ROS  negative psych ROS   GI/Hepatic negative GI ROS, Neg liver ROS,   Endo/Other  negative endocrine ROS  Renal/GU negative Renal ROS  negative genitourinary   Musculoskeletal negative musculoskeletal ROS (+)   Abdominal   Peds negative pediatric ROS (+)  Hematology negative hematology ROS (+)   Anesthesia Other Findings   Reproductive/Obstetrics negative OB ROS                            Anesthesia Physical Anesthesia Plan  ASA: 3  Anesthesia Plan: General   Post-op Pain Management:    Induction: Intravenous  PONV Risk Score and Plan: 2 and Ondansetron, Dexamethasone and Treatment may vary due to age or medical condition  Airway Management Planned: Oral ETT  Additional Equipment:   Intra-op Plan:   Post-operative Plan: Extubation in OR  Informed Consent: I have reviewed the patients History and Physical, chart, labs and discussed the procedure including the risks, benefits and alternatives for the proposed anesthesia with the patient or authorized representative who has indicated his/her understanding and acceptance.     Dental advisory given  Plan Discussed with: CRNA and Surgeon  Anesthesia Plan Comments: (PAT note by Karoline Caldwell, PA-C: Follows with cardiology for hx of afib on Eliquis. Preop clearance per  telephone encounter 04/28/21, "Chart reviewed as part of pre-operative protocol coverage. Patient was contacted11/2/2022in reference to pre-operative risk assessment for pending surgery as outlined below. DEMARUS LATTERELL last seen on 8/25/22by Dr. Percival Spanish. Since that day, ALEK BORGES done well.He can complete more than 4.0 METS without angina. Per our clinical pharmacist: Per office protocol, patient can holdEliquisfor 3days prior to procedure. Therefore, based on ACC/AHA guidelines, the patient would be at acceptable risk for the planned procedure without further cardiovascular testing."  Preop labs reviewed, unremarkable   EKG 02/18/21: Afib. Ventricular rate 94.  CT chest/abd/pelvis 03/29/21: IMPRESSION: VASCULAR  1. 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 Recommend referral to a Vascular Specialist if not already obtained. This recommendation follows ACR consensus guidelines: White Paper of the ACR Incidental Findings Committee II on Vascular Findings. J Am Coll Radiol 2013; 10:789-794. 2. Coronary and aortic atherosclerosis (ICD10-I70.0).  NON VASCULAR  1. No acute abnormality in the chest, abdomen, or pelvis. 2. Evidence of prior granulomatous disease.  TTE 11/14/17: - Left ventricle: The cavity size was normal. Wall thickness was  normal. Systolic function was normal. The estimated ejection  fraction was in the range of 60% to 65%. Wall motion was normal;  there were no regional wall motion abnormalities.  - Mitral valve: There was mild regurgitation. )       Anesthesia Quick Evaluation

## 2021-05-13 ENCOUNTER — Inpatient Hospital Stay (HOSPITAL_COMMUNITY): Payer: Medicare Other | Admitting: Certified Registered"

## 2021-05-13 ENCOUNTER — Other Ambulatory Visit: Payer: Self-pay

## 2021-05-13 ENCOUNTER — Inpatient Hospital Stay (HOSPITAL_COMMUNITY): Payer: Medicare Other

## 2021-05-13 ENCOUNTER — Encounter (HOSPITAL_COMMUNITY)
Admission: RE | Disposition: A | Discharge status: Home / Self Care | Payer: Self-pay | Source: Home / Self Care | Attending: Surgery

## 2021-05-13 ENCOUNTER — Inpatient Hospital Stay (HOSPITAL_COMMUNITY)
Admission: RE | Admit: 2021-05-13 | Discharge: 2021-05-14 | DRG: 269 | Disposition: A | Discharge status: Home / Self Care | Payer: Medicare Other | Attending: Surgery | Admitting: Surgery

## 2021-05-13 ENCOUNTER — Inpatient Hospital Stay (HOSPITAL_COMMUNITY): Payer: Medicare Other | Admitting: Physician Assistant

## 2021-05-13 ENCOUNTER — Encounter (HOSPITAL_COMMUNITY): Payer: Self-pay | Admitting: Surgery

## 2021-05-13 DIAGNOSIS — I739 Peripheral vascular disease, unspecified: Secondary | ICD-10-CM | POA: Diagnosis present

## 2021-05-13 DIAGNOSIS — Z79899 Other long term (current) drug therapy: Secondary | ICD-10-CM

## 2021-05-13 DIAGNOSIS — I714 Abdominal aortic aneurysm, without rupture, unspecified: Secondary | ICD-10-CM | POA: Diagnosis present

## 2021-05-13 DIAGNOSIS — I7141 Pararenal abdominal aortic aneurysm, without rupture: Secondary | ICD-10-CM

## 2021-05-13 DIAGNOSIS — Z20822 Contact with and (suspected) exposure to covid-19: Secondary | ICD-10-CM | POA: Diagnosis present

## 2021-05-13 DIAGNOSIS — I7143 Infrarenal abdominal aortic aneurysm, without rupture: Principal | ICD-10-CM | POA: Diagnosis present

## 2021-05-13 DIAGNOSIS — Z800 Family history of malignant neoplasm of digestive organs: Secondary | ICD-10-CM

## 2021-05-13 DIAGNOSIS — Z885 Allergy status to narcotic agent status: Secondary | ICD-10-CM

## 2021-05-13 DIAGNOSIS — I10 Essential (primary) hypertension: Secondary | ICD-10-CM | POA: Diagnosis present

## 2021-05-13 DIAGNOSIS — Z7901 Long term (current) use of anticoagulants: Secondary | ICD-10-CM

## 2021-05-13 DIAGNOSIS — Z87891 Personal history of nicotine dependence: Secondary | ICD-10-CM | POA: Diagnosis not present

## 2021-05-13 DIAGNOSIS — Z85828 Personal history of other malignant neoplasm of skin: Secondary | ICD-10-CM

## 2021-05-13 DIAGNOSIS — I482 Chronic atrial fibrillation, unspecified: Secondary | ICD-10-CM | POA: Diagnosis present

## 2021-05-13 DIAGNOSIS — Z8261 Family history of arthritis: Secondary | ICD-10-CM

## 2021-05-13 HISTORY — PX: ABDOMINAL AORTIC ENDOVASCULAR STENT GRAFT: SHX5707

## 2021-05-13 LAB — CBC
HCT: 38.7 % — ABNORMAL LOW (ref 39.0–52.0)
Hemoglobin: 13.3 g/dL (ref 13.0–17.0)
MCH: 32.8 pg (ref 26.0–34.0)
MCHC: 34.4 g/dL (ref 30.0–36.0)
MCV: 95.6 fL (ref 80.0–100.0)
Platelets: 219 10*3/uL (ref 150–400)
RBC: 4.05 MIL/uL — ABNORMAL LOW (ref 4.22–5.81)
RDW: 12.1 % (ref 11.5–15.5)
WBC: 9.8 10*3/uL (ref 4.0–10.5)
nRBC: 0 % (ref 0.0–0.2)

## 2021-05-13 LAB — BASIC METABOLIC PANEL
Anion gap: 6 (ref 5–15)
BUN: 7 mg/dL — ABNORMAL LOW (ref 8–23)
CO2: 24 mmol/L (ref 22–32)
Calcium: 8.8 mg/dL — ABNORMAL LOW (ref 8.9–10.3)
Chloride: 107 mmol/L (ref 98–111)
Creatinine, Ser: 0.85 mg/dL (ref 0.61–1.24)
GFR, Estimated: >60 mL/min (ref >60)
Glucose, Bld: 122 mg/dL — ABNORMAL HIGH (ref 70–99)
Potassium: 4.2 mmol/L (ref 3.5–5.1)
Sodium: 137 mmol/L (ref 135–145)

## 2021-05-13 LAB — POCT ACTIVATED CLOTTING TIME
Activated Clotting Time: 219 seconds
Activated Clotting Time: 219 seconds

## 2021-05-13 LAB — PROTIME-INR
INR: 1.1 (ref 0.8–1.2)
Prothrombin Time: 14.7 seconds (ref 11.4–15.2)

## 2021-05-13 LAB — ABO/RH: ABO/RH(D): O POS

## 2021-05-13 LAB — APTT: aPTT: 33 seconds (ref 24–36)

## 2021-05-13 LAB — MAGNESIUM: Magnesium: 2.1 mg/dL (ref 1.7–2.4)

## 2021-05-13 SURGERY — INSERTION, ENDOVASCULAR STENT GRAFT, AORTA, ABDOMINAL
Anesthesia: General

## 2021-05-13 MED ORDER — ONDANSETRON HCL 4 MG/2ML IJ SOLN
INTRAMUSCULAR | Status: AC
  Filled 2021-05-13: qty 2

## 2021-05-13 MED ORDER — HYDRALAZINE HCL 20 MG/ML IJ SOLN: 5.0000 mg | INTRAMUSCULAR | Status: DC | PRN

## 2021-05-13 MED ORDER — SENNOSIDES-DOCUSATE SODIUM 8.6-50 MG PO TABS: 1.0000 | ORAL_TABLET | Freq: Every evening | ORAL | Status: DC | PRN

## 2021-05-13 MED ORDER — CHLORHEXIDINE GLUCONATE 0.12 % MT SOLN
15.0000 mL | Freq: Once | OROMUCOSAL | Status: AC
  Administered 2021-05-13: 06:00:00 15 mL via OROMUCOSAL
  Filled 2021-05-13: qty 15

## 2021-05-13 MED ORDER — PANTOPRAZOLE SODIUM 40 MG PO TBEC
40.0000 mg | DELAYED_RELEASE_TABLET | Freq: Every day | ORAL | Status: DC
  Administered 2021-05-14: 40 mg via ORAL
  Filled 2021-05-13: qty 1

## 2021-05-13 MED ORDER — DOCUSATE SODIUM 100 MG PO CAPS
100.0000 mg | ORAL_CAPSULE | Freq: Every day | ORAL | Status: DC
  Administered 2021-05-14: 100 mg via ORAL
  Filled 2021-05-13: qty 1

## 2021-05-13 MED ORDER — HEPARIN SODIUM (PORCINE) 1000 UNIT/ML IJ SOLN
INTRAMUSCULAR | Status: DC | PRN
  Administered 2021-05-13: 1000 [IU] via INTRAVENOUS
  Administered 2021-05-13: 7000 [IU] via INTRAVENOUS

## 2021-05-13 MED ORDER — HYDROCHLOROTHIAZIDE 25 MG PO TABS
25.0000 mg | ORAL_TABLET | Freq: Every day | ORAL | Status: DC
  Administered 2021-05-14: 25 mg via ORAL
  Filled 2021-05-13: qty 1

## 2021-05-13 MED ORDER — CEFAZOLIN SODIUM-DEXTROSE 2-4 GM/100ML-% IV SOLN
2.0000 g | INTRAVENOUS | Status: AC
  Administered 2021-05-13: 08:00:00 2 g via INTRAVENOUS
  Filled 2021-05-13: qty 100

## 2021-05-13 MED ORDER — PROTAMINE SULFATE 10 MG/ML IV SOLN
INTRAVENOUS | Status: DC | PRN
  Administered 2021-05-13 (×2): 20 mg via INTRAVENOUS
  Administered 2021-05-13: 10 mg via INTRAVENOUS

## 2021-05-13 MED ORDER — CEFAZOLIN SODIUM-DEXTROSE 2-4 GM/100ML-% IV SOLN
2.0000 g | Freq: Three times a day (TID) | INTRAVENOUS | Status: AC
  Administered 2021-05-13 – 2021-05-14 (×2): 2 g via INTRAVENOUS
  Filled 2021-05-13 (×2): qty 100

## 2021-05-13 MED ORDER — LACTATED RINGERS IV SOLN
INTRAVENOUS | Status: DC | PRN
Start: 2021-05-13 — End: 2021-05-13

## 2021-05-13 MED ORDER — GUAIFENESIN-DM 100-10 MG/5ML PO SYRP: 15.0000 mL | ORAL_SOLUTION | ORAL | Status: DC | PRN

## 2021-05-13 MED ORDER — TRAMADOL HCL 50 MG PO TABS: 50.0000 mg | ORAL_TABLET | Freq: Four times a day (QID) | ORAL | Status: DC | PRN

## 2021-05-13 MED ORDER — LIDOCAINE 2% (20 MG/ML) 5 ML SYRINGE
INTRAMUSCULAR | Status: AC
  Filled 2021-05-13: qty 5

## 2021-05-13 MED ORDER — ROSUVASTATIN CALCIUM 20 MG PO TABS
20.0000 mg | ORAL_TABLET | Freq: Every day | ORAL | Status: DC
  Administered 2021-05-14: 20 mg via ORAL
  Filled 2021-05-13: qty 1

## 2021-05-13 MED ORDER — HEPARIN 6000 UNIT IRRIGATION SOLUTION
Status: AC
  Filled 2021-05-13: qty 500

## 2021-05-13 MED ORDER — APIXABAN 5 MG PO TABS
5.0000 mg | ORAL_TABLET | Freq: Two times a day (BID) | ORAL | Status: DC
  Administered 2021-05-14: 5 mg via ORAL
  Filled 2021-05-13: qty 1

## 2021-05-13 MED ORDER — MAGNESIUM SULFATE 2 GM/50ML IV SOLN: 2.0000 g | Freq: Every day | INTRAVENOUS | Status: DC | PRN

## 2021-05-13 MED ORDER — CHLORHEXIDINE GLUCONATE CLOTH 2 % EX PADS: 6.0000 | MEDICATED_PAD | Freq: Once | CUTANEOUS | Status: DC

## 2021-05-13 MED ORDER — HYDRALAZINE HCL 20 MG/ML IJ SOLN
INTRAMUSCULAR | Status: AC
  Filled 2021-05-13: qty 1

## 2021-05-13 MED ORDER — ONDANSETRON HCL 4 MG/2ML IJ SOLN: 4.0000 mg | Freq: Once | INTRAMUSCULAR | Status: DC | PRN

## 2021-05-13 MED ORDER — IODIXANOL 320 MG/ML IV SOLN
INTRAVENOUS | Status: DC | PRN
  Administered 2021-05-13: 07:00:00 30 mL via INTRA_ARTERIAL

## 2021-05-13 MED ORDER — PROPOFOL 10 MG/ML IV BOLUS
INTRAVENOUS | Status: AC
  Filled 2021-05-13: qty 20

## 2021-05-13 MED ORDER — DEXAMETHASONE SODIUM PHOSPHATE 10 MG/ML IJ SOLN
INTRAMUSCULAR | Status: DC | PRN
Start: 2021-05-13 — End: 2021-05-13
  Administered 2021-05-13: 10 mg via INTRAVENOUS

## 2021-05-13 MED ORDER — PHENYLEPHRINE HCL-NACL 20-0.9 MG/250ML-% IV SOLN
INTRAVENOUS | Status: DC | PRN
  Administered 2021-05-13: 25 ug/min via INTRAVENOUS

## 2021-05-13 MED ORDER — PROPOFOL 10 MG/ML IV BOLUS
INTRAVENOUS | Status: DC | PRN
  Administered 2021-05-13: 120 mg via INTRAVENOUS

## 2021-05-13 MED ORDER — METOPROLOL TARTRATE 5 MG/5ML IV SOLN: 2.0000 mg | INTRAVENOUS | Status: DC | PRN

## 2021-05-13 MED ORDER — ACETAMINOPHEN 10 MG/ML IV SOLN: 1000.0000 mg | Freq: Once | INTRAVENOUS | Status: DC | PRN

## 2021-05-13 MED ORDER — LACTATED RINGERS IV SOLN: INTRAVENOUS | Status: DC

## 2021-05-13 MED ORDER — HEPARIN 6000 UNIT IRRIGATION SOLUTION
Status: DC | PRN
  Administered 2021-05-13: 1

## 2021-05-13 MED ORDER — FENTANYL CITRATE (PF) 100 MCG/2ML IJ SOLN: 25.0000 ug | INTRAMUSCULAR | Status: DC | PRN

## 2021-05-13 MED ORDER — ASPIRIN EC 81 MG PO TBEC
81.0000 mg | DELAYED_RELEASE_TABLET | Freq: Every day | ORAL | Status: DC
  Administered 2021-05-14: 81 mg via ORAL
  Filled 2021-05-13: qty 1

## 2021-05-13 MED ORDER — SODIUM CHLORIDE 0.9 % IV SOLN: 500.0000 mL | Freq: Once | INTRAVENOUS | Status: DC | PRN

## 2021-05-13 MED ORDER — FENTANYL CITRATE (PF) 250 MCG/5ML IJ SOLN
INTRAMUSCULAR | Status: AC
  Filled 2021-05-13: qty 5

## 2021-05-13 MED ORDER — DEXAMETHASONE SODIUM PHOSPHATE 10 MG/ML IJ SOLN
INTRAMUSCULAR | Status: AC
  Filled 2021-05-13: qty 1

## 2021-05-13 MED ORDER — SODIUM CHLORIDE 0.9 % IV SOLN: INTRAVENOUS | Status: DC

## 2021-05-13 MED ORDER — ACETAMINOPHEN 325 MG PO TABS: 325.0000 mg | ORAL_TABLET | ORAL | Status: DC | PRN

## 2021-05-13 MED ORDER — ROCURONIUM BROMIDE 10 MG/ML (PF) SYRINGE
PREFILLED_SYRINGE | INTRAVENOUS | Status: DC | PRN
Start: 2021-05-13 — End: 2021-05-13
  Administered 2021-05-13: 65 mg via INTRAVENOUS

## 2021-05-13 MED ORDER — BISACODYL 5 MG PO TBEC: 5.0000 mg | DELAYED_RELEASE_TABLET | Freq: Every day | ORAL | Status: DC | PRN

## 2021-05-13 MED ORDER — ROCURONIUM BROMIDE 10 MG/ML (PF) SYRINGE
PREFILLED_SYRINGE | INTRAVENOUS | Status: AC
  Filled 2021-05-13: qty 10

## 2021-05-13 MED ORDER — LIDOCAINE 2% (20 MG/ML) 5 ML SYRINGE
INTRAMUSCULAR | Status: DC | PRN
  Administered 2021-05-13: 100 mg via INTRAVENOUS

## 2021-05-13 MED ORDER — LABETALOL HCL 5 MG/ML IV SOLN: 10.0000 mg | INTRAVENOUS | Status: DC | PRN

## 2021-05-13 MED ORDER — HYDRALAZINE HCL 20 MG/ML IJ SOLN
5.0000 mg | Freq: Once | INTRAMUSCULAR | Status: AC
  Administered 2021-05-13: 10:00:00 5 mg via INTRAVENOUS

## 2021-05-13 MED ORDER — POTASSIUM CHLORIDE CRYS ER 20 MEQ PO TBCR
20.0000 meq | EXTENDED_RELEASE_TABLET | Freq: Every day | ORAL | Status: DC | PRN
Start: 2021-05-13 — End: 2021-05-14

## 2021-05-13 MED ORDER — ACETAMINOPHEN 650 MG RE SUPP: 325.0000 mg | RECTAL | Status: DC | PRN

## 2021-05-13 MED ORDER — ONDANSETRON HCL 4 MG/2ML IJ SOLN: 4.0000 mg | Freq: Four times a day (QID) | INTRAMUSCULAR | Status: DC | PRN

## 2021-05-13 MED ORDER — ONDANSETRON HCL 4 MG/2ML IJ SOLN
INTRAMUSCULAR | Status: DC | PRN
  Administered 2021-05-13: 4 mg via INTRAVENOUS

## 2021-05-13 MED ORDER — ORAL CARE MOUTH RINSE: 15.0000 mL | Freq: Once | OROMUCOSAL | Status: AC

## 2021-05-13 MED ORDER — PHENOL 1.4 % MT LIQD: 1.0000 | OROMUCOSAL | Status: DC | PRN

## 2021-05-13 MED ORDER — SUGAMMADEX SODIUM 200 MG/2ML IV SOLN
INTRAVENOUS | Status: DC | PRN
  Administered 2021-05-13: 200 mg via INTRAVENOUS

## 2021-05-13 MED ORDER — 0.9 % SODIUM CHLORIDE (POUR BTL) OPTIME
TOPICAL | Status: DC | PRN
  Administered 2021-05-13: 07:00:00 1000 mL

## 2021-05-13 MED ORDER — FENTANYL CITRATE (PF) 250 MCG/5ML IJ SOLN
INTRAMUSCULAR | Status: DC | PRN
  Administered 2021-05-13: 50 ug via INTRAVENOUS
  Administered 2021-05-13: 100 ug via INTRAVENOUS

## 2021-05-13 MED ORDER — ALUM & MAG HYDROXIDE-SIMETH 200-200-20 MG/5ML PO SUSP: 15.0000 mL | ORAL | Status: DC | PRN

## 2021-05-13 MED ORDER — PHENYLEPHRINE 40 MCG/ML (10ML) SYRINGE FOR IV PUSH (FOR BLOOD PRESSURE SUPPORT)
PREFILLED_SYRINGE | INTRAVENOUS | Status: DC | PRN
  Administered 2021-05-13: 80 ug via INTRAVENOUS

## 2021-05-13 MED ORDER — MIDAZOLAM HCL 2 MG/2ML IJ SOLN
INTRAMUSCULAR | Status: AC
  Filled 2021-05-13: qty 2

## 2021-05-13 SURGICAL SUPPLY — 58 items
BAG COUNTER SPONGE SURGICOUNT (BAG) ×2 IMPLANT
BLADE CLIPPER SURG (BLADE) IMPLANT
CANISTER SUCT 3000ML PPV (MISCELLANEOUS) ×2 IMPLANT
CATH BEACON 5.038 65CM KMP-01 (CATHETERS) ×2 IMPLANT
CATH OMNI FLUSH .035X70CM (CATHETERS) ×2 IMPLANT
DERMABOND ADVANCED (GAUZE/BANDAGES/DRESSINGS) ×2
DERMABOND ADVANCED .7 DNX12 (GAUZE/BANDAGES/DRESSINGS) ×2 IMPLANT
DEVICE CLOSURE PERCLS PRGLD 6F (VASCULAR PRODUCTS) ×4 IMPLANT
DEVICE TORQUE H2O (MISCELLANEOUS) IMPLANT
DRSG TEGADERM 2-3/8X2-3/4 SM (GAUZE/BANDAGES/DRESSINGS) ×4 IMPLANT
DRYSEAL FLEXSHEATH 12FR 33CM (SHEATH) ×1
DRYSEAL FLEXSHEATH 16FR 33CM (SHEATH) ×1
ELECT CAUTERY BLADE 6.4 (BLADE) ×2 IMPLANT
ELECT REM PT RETURN 9FT ADLT (ELECTROSURGICAL) ×2
ELECTRODE REM PT RTRN 9FT ADLT (ELECTROSURGICAL) ×1 IMPLANT
EXCLDR TRNK ENDO 26X12X12 16F (Endovascular Graft) ×2 IMPLANT
EXCLUDER TNK END 26X12X12 16F (Endovascular Graft) ×1 IMPLANT
GAUZE SPONGE 2X2 8PLY STRL LF (GAUZE/BANDAGES/DRESSINGS) ×2 IMPLANT
GLOVE SRG 8 PF TXTR STRL LF DI (GLOVE) ×1 IMPLANT
GLOVE SURG POLYISO LF SZ7.5 (GLOVE) ×2 IMPLANT
GLOVE SURG UNDER POLY LF SZ8 (GLOVE) ×1
GOWN STRL REUS W/ TWL LRG LVL3 (GOWN DISPOSABLE) ×1 IMPLANT
GOWN STRL REUS W/ TWL XL LVL3 (GOWN DISPOSABLE) ×3 IMPLANT
GOWN STRL REUS W/TWL LRG LVL3 (GOWN DISPOSABLE) ×1
GOWN STRL REUS W/TWL XL LVL3 (GOWN DISPOSABLE) ×3
GRAFT BALLN CATH 65CM (STENTS) ×1 IMPLANT
GUIDEWIRE ANGLED .035X150CM (WIRE) IMPLANT
KIT BASIN OR (CUSTOM PROCEDURE TRAY) ×2 IMPLANT
KIT DRAIN CSF ACCUDRAIN (MISCELLANEOUS) IMPLANT
KIT TURNOVER KIT B (KITS) ×2 IMPLANT
LEG CONTRALATERAL 16X12X10 (Vascular Products) ×1 IMPLANT
LEG CONTRALATERAL 16X14.5X14 (Vascular Products) ×2 IMPLANT
NS IRRIG 1000ML POUR BTL (IV SOLUTION) ×2 IMPLANT
PACK ENDOVASCULAR (PACKS) ×2 IMPLANT
PAD ARMBOARD 7.5X6 YLW CONV (MISCELLANEOUS) ×4 IMPLANT
PENCIL BUTTON HOLSTER BLD 10FT (ELECTRODE) ×2 IMPLANT
PERCLOSE PROGLIDE 6F (VASCULAR PRODUCTS) ×8
SET MICROPUNCTURE 5F STIFF (MISCELLANEOUS) ×2 IMPLANT
SHEATH BRITE TIP 8FR 23CM (SHEATH) ×2 IMPLANT
SHEATH DRYSEAL FLEX 12FR 33CM (SHEATH) ×1 IMPLANT
SHEATH DRYSEAL FLEX 16FR 33CM (SHEATH) ×1 IMPLANT
SHEATH PINNACLE 8F 10CM (SHEATH) ×2 IMPLANT
SPONGE GAUZE 2X2 STER 10/PKG (GAUZE/BANDAGES/DRESSINGS) ×2
STENT GRAFT BALLN CATH 65CM (STENTS) ×1
STENT GRAFT CONTRALAT 16X12X10 (Vascular Products) ×1 IMPLANT
STOPCOCK MORSE 400PSI 3WAY (MISCELLANEOUS) ×2 IMPLANT
SUT PROLENE 5 0 C 1 24 (SUTURE) IMPLANT
SUT VIC AB 2-0 CT1 27 (SUTURE)
SUT VIC AB 2-0 CT1 TAPERPNT 27 (SUTURE) IMPLANT
SUT VIC AB 3-0 SH 27 (SUTURE)
SUT VIC AB 3-0 SH 27X BRD (SUTURE) IMPLANT
SUT VICRYL 4-0 PS2 18IN ABS (SUTURE) IMPLANT
SYR 20ML LL LF (SYRINGE) ×2 IMPLANT
TOWEL GREEN STERILE (TOWEL DISPOSABLE) ×2 IMPLANT
TRAY FOLEY MTR SLVR 16FR STAT (SET/KITS/TRAYS/PACK) ×2 IMPLANT
TUBING HIGH PRESSURE 120CM (CONNECTOR) ×2 IMPLANT
WIRE AMPLATZ SS-J .035X180CM (WIRE) ×4 IMPLANT
WIRE BENTSON .035X145CM (WIRE) ×4 IMPLANT

## 2021-05-13 NOTE — Transfer of Care (Signed)
Immediate Anesthesia Transfer of Care Note  Patient: Jacob Singleton  Procedure(s) Performed: ABDOMINAL AORTIC ENDOVASCULAR STENT GRAFT  Patient Location: PACU  Anesthesia Type:General  Level of Consciousness: awake, alert  and oriented  Airway & Oxygen Therapy: Patient Spontanous Breathing and Patient connected to nasal cannula oxygen  Post-op Assessment: Report given to RN, Post -op Vital signs reviewed and stable and Patient moving all extremities X 4  Post vital signs: Reviewed and stable  Last Vitals:  Vitals Value Taken Time  BP 144/88 05/13/21 0923  Temp    Pulse 101 05/13/21 0925  Resp 16 05/13/21 0925  SpO2 97 % 05/13/21 0925  Vitals shown include unvalidated device data.  Last Pain:  Vitals:   05/13/21 0610  TempSrc:   PainSc: 0-No pain         Complications: No notable events documented.

## 2021-05-13 NOTE — Op Note (Signed)
Patient name: Jacob Singleton MRN: [id number] DOB: 09-11-1940 Sex: male  05/13/2021 Pre-operative Diagnosis: Abdominal aortic aneurysm Post-operative diagnosis:  Same Surgeon:  Annamarie Major Assistants:  Ivin Booty, PA Procedure:   #1: Endovascular repair of infrarenal abdominal aortic aneurysm   #2: Bilateral ultrasound-guided percutaneous common femoral artery access   #3: Catheter in aorta x2   #4: Abdominal aortogram Anesthesia:  General Blood Loss:  minimal Specimens:  none  Findings: Complete exclusion  Devices used: Main body was primary left Gore 26 x 12 x 12.  Ipsilateral left extension was a Gore 12 x 10.  Contralateral right was a Gore 14 x 14  Indications: This is a 80 year old gentleman with a 5.5 cm infrarenal abdominal aortic aneurysm who comes in today for endovascular repair.  Procedure:  The patient was identified in the holding area and taken to [place] [facility]  The patient was then placed supine on the table. general anesthesia was administered.  The patient was prepped and draped in the usual sterile fashion.  A time out was called and antibiotics were administered.  A PA was necessary to expedite the procedure and assist with technical details.  Bilateral common femoral arteries were evaluated with ultrasound and found be widely patent without significant calcification.  A #11 blade was used to make a skin nick.  Bilateral common femoral arteries were cannulated under ultrasound guidance with a micropuncture needle.  A 018 wire was advanced without resistance followed by placement of micropuncture sheath.  A Bentson wire was then inserted into the abdominal aorta and the subcutaneous tract were dilated with a [place] dilator.  Pro-glide devices were deployed at the 11:00 and 1 o'clock position for preclosure.  Next, over a Amplatz wire, a 83 French sheath was placed into the left femoral artery and a 12 French sheath was placed on the right.  The patient was fully  heparinized.  Heparin levels were monitored with ACT measurements and redosed appropriately.  The main body device was prepared on the back table.  This was a Gore 26 x 12 x 12 device.  It was advanced up the left side into the aorta.  A Omni Flush catheter was placed at the level of L1 and abdominal aortogram was performed locating the renal arteries.  The main body device was then deployed down to the contralateral gate.  I then reconstructing the device and pulled the Amplatz wire back into the sheath and then turned the device so that it sat appropriately and the angulation within the infrarenal aorta.  Next, the Amplatz wire was then advanced into the descending thoracic aorta.  The contralateral gate was then cannulated with a Berenstein 2 catheter and a Bentson wire.  The Berenstein catheter was replaced with an Omni Flush catheter which was able to be freely rotated within the main body, confirming successful cannulation.  The Omni Flush catheter was then advanced to the level of L1 and abdominal aortogram was performed.  This showed the device was in good position.  Therefore the constraining tie was released.  Next the image detector was rotated to a left anterior oblique position and a retrograde injection was performed through the sheath locating the right hypogastric artery.  The contralateral limb which was a Gore 14 x 14 device was inserted and deployed landing just short of the hypogastric artery on the right.  The image detector was then rotated to a right anterior oblique position and a retrograde injection was performed to the  sheath locating the left hypogastric artery.  I then deployed the remaining portion of the ipsilateral main body and extended this with a Gore 12 x 10 device landing just short of the left hypogastric artery.  Next, a MOB balloon was inserted and used to mold the proximal and distal attachment site as well as device overlap.  A completion arteriogram was then performed which  showed successful exclusion of the aneurysm.  There was preservation of blood flow throughout both renal arteries as well as both internal and external iliac arteries.  There was a late type II leak.  The Amplatz wires were then exchanged out for Bentson wires.  The sheaths were then removed and the Pro-glide devices were used to close the arteriotomy site.  This was done on both sides.  The patient was given 50 mg of protamine.  Cautery was used within the subcutaneous tissue.  A 4-0 Vicryl was used to close the left groin cannulation site.  Dermabond was applied.  Patient had brisk pedal Doppler signals after the case.  He was successfully extubated and taken recovery in stable condition.  There were no immediate complications.   Disposition: To PACU stable.   Theotis Burrow, M.D., [facility] [place] Office: [phone] Pager:  [phone]

## 2021-05-13 NOTE — Interval H&P Note (Signed)
History and Physical Interval Note:  05/13/2021 7:25 AM  Jacob Singleton  has presented today for surgery, with the diagnosis of AAA.  The various methods of treatment have been discussed with the patient and family. After consideration of risks, benefits and other options for treatment, the patient has consented to  Procedure(s): ABDOMINAL AORTIC ENDOVASCULAR STENT GRAFT (N/A) as a surgical intervention.  The patient's history has been reviewed, patient examined, no change in status, stable for surgery.  I have reviewed the patient's chart and labs.  Questions were answered to the patient's satisfaction.     Annamarie Major

## 2021-05-13 NOTE — Progress Notes (Signed)
Mobility Specialist: Progress Note   05/13/21 1546  Mobility  Activity Ambulated in hall  Level of Assistance Independent after set-up  Assistive Device  (IV Pole)  Distance Ambulated (ft) 470 ft  Mobility Ambulated with assistance in hallway  Mobility Response Tolerated well  Mobility performed by Mobility specialist  $Mobility charge 1 Mobility   Pre-Mobility: 120 HR, 96% SpO2 During Mobility: 138 HR  Pt c/o some pain/tightness in his groin during ambulation, otherwise no c/o. Pt back to bed after walk with call bell at his side.   [facility] Ifeoma Vallin Mobility Specialist Mobility Specialist Phone: [phone]

## 2021-05-13 NOTE — Anesthesia Procedure Notes (Signed)
Procedure Name: Intubation Date/Time: 05/13/2021 7:47 AM Performed by: Gaylene Brooks, CRNA Pre-anesthesia Checklist: Patient identified, Emergency Drugs available, Suction available and Patient being monitored Patient Re-evaluated:Patient Re-evaluated prior to induction Oxygen Delivery Method: Circle System Utilized Preoxygenation: Pre-oxygenation with 100% oxygen Induction Type: IV induction Ventilation: Mask ventilation without difficulty Laryngoscope Size: Miller and 2 Grade View: Grade I Tube type: Oral Tube size: 7.5 mm Number of attempts: 1 Airway Equipment and Method: Stylet and Oral airway Placement Confirmation: ETT inserted through vocal cords under direct vision, positive ETCO2 and breath sounds checked- equal and bilateral Secured at: 22 cm Tube secured with: Tape Dental Injury: Teeth and Oropharynx as per pre-operative assessment

## 2021-05-13 NOTE — Discharge Instructions (Addendum)
[facility]   Discharge Instructions  Endovascular Aortic Aneurysm Repair  Please refer to the following instructions for your post-procedure care. Your surgeon or Physician Assistant will discuss any changes with you.  Activity  You are encouraged to walk as much as you can. You can slowly return to normal activities but must avoid strenuous activity and heavy lifting until your doctor tells you it's OK. Avoid activities such as vacuuming or swinging a gold club. It is normal to feel tired for several weeks after your surgery. Do not drive until your doctor gives the OK and you are no longer taking prescription pain medications. It is also normal to have difficulty with sleep habits, eating, and bowel movements after surgery. These will go away with time.  Bathing/Showering  Shower daily after you go home.  Do not soak in a bathtub, hot tub, or swim until the incision heals completely.  If you have incisions in your groin, wash the groin wounds with soap and water daily and pat dry. (No tub bath-only shower)  Then put a dry gauze or washcloth there to keep this area dry to help prevent wound infection daily and as needed.  Do not use Vaseline or neosporin on your incisions.  Only use soap and water on your incisions and then protect and keep dry.  Incision Care  Shower every day. Clean your incision with mild soap and water. Pat the area dry with a clean towel. You do not need a bandage unless otherwise instructed. Do not apply any ointments or creams to your incision. If you clothing is irritating, you may cover your incision with a dry gauze pad.  Diet  Resume your normal diet. There are no special food restrictions following this procedure. A low fat/low cholesterol diet is recommended for all patients with vascular disease. In order to heal from your surgery, it is CRITICAL to get adequate nutrition. Your body requires vitamins, minerals, and protein.  Vegetables are the best source of vitamins and minerals. Vegetables also provide the perfect balance of protein. Processed food has little nutritional value, so try to avoid this.  Medications  Resume taking all of your medications unless your doctor or nurse practitioner tells you not to. If your incision is causing pain, you may take over-the-counter pain relievers such as acetaminophen (Tylenol). If you were prescribed a stronger pain medication, please be aware these medications can cause nausea and constipation. Prevent nausea by taking the medication with a snack or meal. Avoid constipation by drinking plenty of fluids and eating foods with a high amount of fiber, such as fruits, vegetables, and grains.  Do not take Tylenol if you are taking prescription pain medications.   Follow up  [place] office will schedule a follow-up appointment with a CT scan 3-4 weeks after your surgery.  Please call us immediately for any of the following conditions  Severe or worsening pain in your legs or feet or in your abdomen back or chest. Increased pain, redness, drainage (pus) from your incision site. Increased abdominal pain, bloating, nausea, vomiting or persistent diarrhea. Fever of 101 degrees or higher. Swelling in your leg (s),  Reduce your risk of vascular disease  Stop smoking. If you would like help call [facility] at 1-800-QUIT-NOW 650-535-5943) or [place] at [phone]. Manage your cholesterol Maintain a desired weight Control your diabetes Keep your blood pressure down  If you have questions, please call the office at [phone].    Information on my  medicine - ELIQUIS (apixaban)  This medication education was reviewed with me or my healthcare representative as part of my discharge preparation.  Why was Eliquis prescribed for you? Eliquis was prescribed for you to reduce the risk of a blood clot forming that can cause a stroke if you have a medical condition called  atrial fibrillation (a type of irregular heartbeat).  What do You need to know about Eliquis ? Take your Eliquis TWICE DAILY - one tablet in the morning and one tablet in the evening with or without food. If you have difficulty swallowing the tablet whole please discuss with your pharmacist how to take the medication safely.  Take Eliquis exactly as prescribed by your doctor and DO NOT stop taking Eliquis without talking to the doctor who prescribed the medication.  Stopping may increase your risk of developing a stroke.  Refill your prescription before you run out.  After discharge, you should have regular check-up appointments with your healthcare provider that is prescribing your Eliquis.  In the future your dose may need to be changed if your kidney function or weight changes by a significant amount or as you get older.  What do you do if you miss a dose? If you miss a dose, take it as soon as you remember on the same day and resume taking twice daily.  Do not take more than one dose of ELIQUIS at the same time to make up a missed dose.  Important Safety Information A possible side effect of Eliquis is bleeding. You should call your healthcare provider right away if you experience any of the following: Bleeding from an injury or your nose that does not stop. Unusual colored urine (red or dark brown) or unusual colored stools (red or black). Unusual bruising for unknown reasons. A serious fall or if you hit your head (even if there is no bleeding).  Some medicines may interact with Eliquis and might increase your risk of bleeding or clotting while on Eliquis. To help avoid this, consult your healthcare provider or pharmacist prior to using any new prescription or non-prescription medications, including herbals, vitamins, non-steroidal anti-inflammatory drugs (NSAIDs) and supplements.  This website has more information on Eliquis (apixaban): http://www.eliquis.com/eliquis/home

## 2021-05-13 NOTE — Anesthesia Procedure Notes (Signed)
Arterial Line Insertion Start/End11/17/2022 6:50 AM, 05/13/2021 7:00 AM Performed by: Gaylene Brooks, CRNA  Patient location: Pre-op. Preanesthetic checklist: patient identified, IV checked, site marked, risks and benefits discussed, surgical consent, monitors and equipment checked, pre-op evaluation, timeout performed and anesthesia consent Lidocaine 1% used for infiltration Right, radial was placed Catheter size: 20 G Hand hygiene performed  and maximum sterile barriers used  Allen's test indicative of satisfactory collateral circulation Attempts: 2 Procedure performed without using ultrasound guided technique. Following insertion, Biopatch and dressing applied. Post procedure assessment: normal  Patient tolerated the procedure well with no immediate complications.

## 2021-05-13 NOTE — Anesthesia Postprocedure Evaluation (Signed)
Anesthesia Post Note  Patient: Jacob Singleton  Procedure(s) Performed: ABDOMINAL AORTIC ENDOVASCULAR STENT GRAFT     Patient location during evaluation: PACU Anesthesia Type: General Level of consciousness: awake and alert Pain management: pain level controlled Vital Signs Assessment: post-procedure vital signs reviewed and stable Respiratory status: spontaneous breathing, nonlabored ventilation, respiratory function stable and patient connected to nasal cannula oxygen Cardiovascular status: blood pressure returned to baseline and stable Postop Assessment: no apparent nausea or vomiting Anesthetic complications: no   No notable events documented.  Last Vitals:  Vitals:   05/13/21 0955 05/13/21 1010  BP: 121/83 112/79  Pulse: (!) 107 (!) 115  Resp: 18 17  Temp:    SpO2: 98% 96%    Last Pain:  Vitals:   05/13/21 0955  TempSrc:   PainSc: 0-No pain    LLE Motor Response: Purposeful movement (05/13/21 1010) LLE Sensation: Full sensation (05/13/21 1010) RLE Motor Response: Purposeful movement (05/13/21 1010) RLE Sensation: Full sensation (05/13/21 1010)      [facility]

## 2021-05-13 NOTE — Progress Notes (Signed)
Arrived from the PACU.  A&Ox4.  Bilateral groins intact-level 0 CCMD notified.  Vital signs per protocol.  Oriented to room.

## 2021-05-14 ENCOUNTER — Encounter (HOSPITAL_COMMUNITY): Payer: Self-pay | Admitting: Surgery

## 2021-05-14 DIAGNOSIS — Z95828 Presence of other vascular implants and grafts: Secondary | ICD-10-CM

## 2021-05-14 LAB — CBC
HCT: 34.2 % — ABNORMAL LOW (ref 39.0–52.0)
Hemoglobin: 11.9 g/dL — ABNORMAL LOW (ref 13.0–17.0)
MCH: 32.8 pg (ref 26.0–34.0)
MCHC: 34.8 g/dL (ref 30.0–36.0)
MCV: 94.2 fL (ref 80.0–100.0)
Platelets: 209 10*3/uL (ref 150–400)
RBC: 3.63 MIL/uL — ABNORMAL LOW (ref 4.22–5.81)
RDW: 12.1 % (ref 11.5–15.5)
WBC: 12 10*3/uL — ABNORMAL HIGH (ref 4.0–10.5)
nRBC: 0 % (ref 0.0–0.2)

## 2021-05-14 LAB — BASIC METABOLIC PANEL
Anion gap: 5 (ref 5–15)
BUN: 10 mg/dL (ref 8–23)
CO2: 22 mmol/L (ref 22–32)
Calcium: 8.7 mg/dL — ABNORMAL LOW (ref 8.9–10.3)
Chloride: 108 mmol/L (ref 98–111)
Creatinine, Ser: 1.02 mg/dL (ref 0.61–1.24)
GFR, Estimated: >60 mL/min (ref >60)
Glucose, Bld: 139 mg/dL — ABNORMAL HIGH (ref 70–99)
Potassium: 3.7 mmol/L (ref 3.5–5.1)
Sodium: 135 mmol/L (ref 135–145)

## 2021-05-14 MED ORDER — ROSUVASTATIN CALCIUM 20 MG PO TABS: 20.0000 mg | ORAL_TABLET | Freq: Every day | ORAL | 11 refills | Status: AC

## 2021-05-14 MED ORDER — TRAMADOL HCL 50 MG PO TABS: 50.0000 mg | ORAL_TABLET | Freq: Four times a day (QID) | ORAL | 0 refills | Status: AC | PRN

## 2021-05-14 MED ORDER — ASPIRIN 81 MG PO TBEC
81.0000 mg | DELAYED_RELEASE_TABLET | Freq: Every day | ORAL | 11 refills | Status: DC
Start: 2021-05-15 — End: 2021-12-27

## 2021-05-14 NOTE — Discharge Summary (Signed)
[facility] Discharge Summary   Jacob Singleton 09-04-1940 80 y.o. 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 The pt tolerated the procedure well and was transported to the PACU in excellent condition.    By POD#1 he continued to do great. No post operative pain. Bilateral femoral access sites clean, dry and intact without swelling or hematoma. Tolerated ambulation.Tolerated diet. Voiding without difficulty.  The remainder of the hospital course consisted of increasing mobilization and increasing intake of solids without difficulty.  He remained stable for discharge home. He will continue Aspirin and statin. New prescriptions sent to patients pharmacy at the [facility] in [place]. Resume all home medications as prescribed including Eliquis 05/15/21. He goes to [place] for the winter so in discussion with Dr. Trula Slade he is okay to follow up in 3 months with CTA chest/ abdomen/ pelvis  CBC    Component Value Date/Time   WBC 12.0 (H) 05/14/2021 0344   RBC 3.63 (L) 05/14/2021 0344   HGB 11.9 (L) 05/14/2021 0344   HCT 34.2 (L) 05/14/2021 0344   PLT 209 05/14/2021 0344   MCV 94.2 05/14/2021 0344   MCH 32.8 05/14/2021 0344   MCHC 34.8 05/14/2021 0344   RDW 12.1 05/14/2021 0344    BMET    Component Value Date/Time   NA 135 05/14/2021 0344   K 3.7 05/14/2021 0344   CL 108 05/14/2021 0344   CO2 22 05/14/2021 0344   GLUCOSE 139 (H) 05/14/2021 0344   BUN 10 05/14/2021 0344   CREATININE 1.02 05/14/2021 0344    CREATININE 0.98 02/04/2016 1313   CALCIUM 8.7 (L) 05/14/2021 0344   GFRNONAA >60 05/14/2021 0344   GFRNONAA 76 02/04/2016 1313   GFRAA 87 02/04/2016 1313       Discharge Instructions     Call MD for:  redness, tenderness, or signs of infection (pain, swelling, redness, odor or green/yellow discharge around incision site)   Complete by: As directed    Call MD for:  severe uncontrolled pain   Complete by: As directed    Call MD for:  temperature >100.4   Complete by: As directed    Diet - low sodium heart healthy   Complete by: As directed    Discharge patient   Complete by: As directed    Discharge disposition: 01-Home or Self Care   Discharge patient date: 05/14/2021   Discharge wound care:   Complete by: As directed    May take groin dressings off the evening of 05/14/21. Shower as normal. 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 05/13/2021   Chronic atrial fibrillation ([place]) 02/10/2020  Educated about COVID-19 virus infection 02/10/2020   AAA (abdominal aortic aneurysm) without rupture 02/11/2019   Benign essential HTN 01/22/2016   Persistent atrial fibrillation ([place]) 01/22/2016   Past Medical History:  Diagnosis Date   A-fib ([place])    Cancer ([place])    skin cancer   Hypertension      Allergies as of 05/14/2021       Reactions   Codeine Other (See Comments)   PASSED OUT         Medication List     TAKE these medications    apixaban 5 MG Tabs tablet Commonly known as: ELIQUIS Take 5 mg by mouth 2 (two) times daily.   aspirin 81 MG EC tablet Take  1 tablet (81 mg total) by mouth daily at 6 (six) AM. Swallow whole. Start taking on: May 15, 2021   COQ10 PO Take 1 tablet by mouth daily.   hydrochlorothiazide 25 MG tablet Commonly known as: HYDRODIURIL Take 25 mg by mouth daily.   latanoprost 0.005 % ophthalmic solution Commonly known as: XALATAN Place 1 drop into both eyes at bedtime.   MAGNESIUM PO Take 1 tablet by mouth daily.   multivitamin with minerals Tabs tablet Take 1 tablet by mouth daily.   potassium chloride SA 20 MEQ tablet Commonly known as: KLOR-CON Take 40 mEq by mouth daily.   rosuvastatin 20 MG tablet Commonly known as: CRESTOR Take 1 tablet (20 mg total) by mouth daily.   traMADol 50 MG tablet Commonly known as: ULTRAM Take 1 tablet (50 mg total) by mouth every 6 (six) hours as needed for moderate pain.   ZINC PO Take 1 tablet by mouth daily.               Discharge Care Instructions  (From admission, onward)           Start     Ordered   05/14/21 0000  Discharge wound care:       Comments: May take groin dressings off the evening of 05/14/21. Shower as normal. Do not soak in bathtub   05/14/21 0944            Discharge Instructions:   [facility]  Discharge Instructions Endovascular Aortic Aneurysm Repair  Please refer to the following instructions for your post-procedure care. Your surgeon or Physician Assistant will discuss any changes with you.  Activity  You are encouraged to walk as much as you can. You can slowly return to normal activities but must avoid strenuous activity and heavy lifting until your doctor tells you it's OK. Avoid activities such as vacuuming or swinging a gold club. It is normal to feel tired for several weeks after your surgery. Do not drive until your doctor gives the OK and you are no longer taking prescription pain medications. It is also normal to have difficulty with sleep habits, eating, and bowel  movements after surgery. These will go away with time.  Bathing/Showering  You may shower after you go home. If you have an incision, do not soak in a bathtub, hot tub, or swim until the incision heals completely.  Incision Care  Shower every day. Clean your incision with mild soap and water. Pat the area dry with a clean towel. You do not need a bandage unless otherwise instructed. Do not apply any ointments or creams to your incision. If you clothing is irritating, you may cover your incision with a dry gauze pad.  Diet  Resume  your normal diet. There are no special food restrictions following this procedure. A low fat/low cholesterol diet is recommended for all patients with vascular disease. In order to heal from your surgery, it is CRITICAL to get adequate nutrition. Your body requires vitamins, minerals, and protein. Vegetables are the best source of vitamins and minerals. Vegetables also provide the perfect balance of protein. Processed food has little nutritional value, so try to avoid this.  Medications  Resume taking all of your medications unless your doctor or Physician Assistnat tells you not to. If your incision is causing pain, you may take over-the-counter pain relievers such as acetaminophen (Tylenol). If you were prescribed a stronger pain medication, please be aware these medications can cause nausea and constipation. Prevent nausea by taking the medication with a snack or meal. Avoid constipation by drinking plenty of fluids and eating foods with a high amount of fiber, such as fruits, vegetables, and grains. Do not take Tylenol if you are taking prescription pain medications.   Follow up  [place] office will schedule a follow-up appointment with a C.T. scan 3-4 weeks after your surgery.  Please call us immediately for any of the following conditions  Severe or worsening pain in your legs or feet or in your abdomen back or chest. Increased pain, redness, drainage (pus) from  your incision sit. Increased abdominal pain, bloating, nausea, vomiting or persistent diarrhea. Fever of 101 degrees or higher. Swelling in your leg (s),  Reduce your risk of vascular disease  Stop smoking. If you would like help call [facility] at 1-800-QUIT-NOW (931)547-5442) or [place] at [phone]. Manage your cholesterol Maintain a desired weight Control your diabetes Keep your blood pressure down  If you have questions, please call the office at [phone].    Prescriptions given: Tramadol #6 No Refill  Disposition: home  Patient's condition: is Excellent  Follow up: 1. Dr. Trula Slade in Months with CTA protocol   Karoline Caldwell, PA-C [facility] [phone] 05/14/2021  10:11 AM   - For VQI Registry use - Post-op:  Time to Extubation: [X]  In OR, [ ]  < 12 hrs, [ ]  12-24 hrs, [ ]  >=24 hrs Vasopressors Req. Post-op: No MI: No., [ ]  Troponin only, [ ]  EKG or Clinical New Arrhythmia: No CHF: No ICU Stay: 0 days Transfusion: No     If yes, 0 units given  Complications: Resp failure: No., [ ]  Pneumonia, [ ]  Ventilator Chg in renal function: No., [ ]  Inc. Cr > 0.5, [ ]  Temp. Dialysis,  [ ]  Permanent dialysis Leg ischemia: No., no Surgery needed, [ ]  Yes, Surgery needed,  [ ]  Amputation Bowel ischemia: No., [ ]  Medical Rx, [ ]  Surgical Rx Wound complication: No., [ ]  Superficial separation/infection, [ ]  Return to OR Return to OR: No  Return to OR for bleeding: No Stroke: No., [ ]  Minor, [ ]  Major  Discharge medications: Statin use:  Yes  ASA use:  Yes  Plavix use:  No  Beta blocker use:  No  ARB use:  No ACEI use:  No CCB use:  No

## 2021-05-14 NOTE — Progress Notes (Signed)
Mobility Specialist: Progress Note   05/14/21 1036  Mobility  Activity Ambulated in hall  Level of Assistance Independent  Assistive Device None  Distance Ambulated (ft) 470 ft  Mobility Ambulated with assistance in hallway  Mobility Response Tolerated well  Mobility performed by Mobility specialist  $Mobility charge 1 Mobility   Pre-Mobility: 86 HR, 99% SpO2 Post-Mobility: 98 HR, 98% SpO2  Pt independent throughout and had no c/o during ambulation. Pt back to bed after walk and is waiting for discharge.   [facility] Mobility Specialist Mobility Specialist Phone #1: [phone] Mobility Specialist Phone #2: [phone]

## 2021-05-14 NOTE — Progress Notes (Signed)
  Progress Note    05/14/2021 8:02 AM 1 Day Post-Op  Subjective:  no complaints. Has not yet voided. Tolerating diet. Ambulated down the hall last night. No pain   Vitals:   05/14/21 0100 05/14/21 0415  BP: 100/71 112/70  Pulse: 82 89  Resp: 16 13  Temp:  98.5 F (36.9 C)  SpO2: 95% 95%   Physical Exam: Cardiac:  regular Lungs:  non labored Extremities:  bilateral groin access sites is clean, dry and intact without swelling or hematomas Abdomen:  soft, non tender Neurologic: alert and oriented  CBC    Component Value Date/Time   WBC 12.0 (H) 05/14/2021 0344   RBC 3.63 (L) 05/14/2021 0344   HGB 11.9 (L) 05/14/2021 0344   HCT 34.2 (L) 05/14/2021 0344   PLT 209 05/14/2021 0344   MCV 94.2 05/14/2021 0344   MCH 32.8 05/14/2021 0344   MCHC 34.8 05/14/2021 0344   RDW 12.1 05/14/2021 0344    BMET    Component Value Date/Time   NA 135 05/14/2021 0344   K 3.7 05/14/2021 0344   CL 108 05/14/2021 0344   CO2 22 05/14/2021 0344   GLUCOSE 139 (H) 05/14/2021 0344   BUN 10 05/14/2021 0344   CREATININE 1.02 05/14/2021 0344   CREATININE 0.98 02/04/2016 1313   CALCIUM 8.7 (L) 05/14/2021 0344   GFRNONAA >60 05/14/2021 0344   GFRNONAA 76 02/04/2016 1313   GFRAA 87 02/04/2016 1313    INR    Component Value Date/Time   INR 1.1 05/13/2021 0925     Intake/Output Summary (Last 24 hours) at 05/14/2021 0802 Last data filed at 05/14/2021 0620 Gross per 24 hour  Intake 2720 ml  Output 1025 ml  Net 1695 ml     Assessment/Plan:  80 y.o. male is s/p EVAR 1 Day Post-Op   Patient is doing excellent. Is not having any pain Bilateral groin access sites clean, dry and intact without swelling or hematoma Bilateral lower extremities well perfused and warm with palpable pedal pulses Patient has ambulated without difficulty Tolerating diet Will resume his home medications as prescribed and will start Aspirin and Statin Restart Eliquis tomorrow 11/19 He is stable for discharge  home once he has voided He will follow up in 3 months with CTA to see Dr. Marcell Anger, PA-C [facility] [phone] 05/14/2021 8:02 AM

## 2021-05-24 ENCOUNTER — Telehealth: Payer: Self-pay

## 2021-05-24 NOTE — Telephone Encounter (Signed)
Heather from [facility] calls today to report that patient has a rash on his back and would like an RX for hydrocortisone cream. He recently had an [facility] - instructed that they would need to call PCP for rash management.

## 2021-06-03 ENCOUNTER — Other Ambulatory Visit: Payer: Self-pay | Admitting: *Deleted

## 2021-06-03 ENCOUNTER — Encounter: Payer: Self-pay | Admitting: Vascular Surgery

## 2021-06-03 DIAGNOSIS — I7143 Infrarenal abdominal aortic aneurysm, without rupture: Secondary | ICD-10-CM

## 2021-07-12 ENCOUNTER — Ambulatory Visit (INDEPENDENT_AMBULATORY_CARE_PROVIDER_SITE_OTHER): Payer: Medicare Other

## 2021-07-12 ENCOUNTER — Other Ambulatory Visit: Payer: Self-pay

## 2021-07-12 DIAGNOSIS — I7143 Infrarenal abdominal aortic aneurysm, without rupture: Secondary | ICD-10-CM | POA: Diagnosis not present

## 2021-07-12 MED ORDER — IOHEXOL 350 MG/ML SOLN
100.0000 mL | Freq: Once | INTRAVENOUS | Status: AC | PRN
  Administered 2021-07-12: 100 mL via INTRAVENOUS

## 2021-08-16 ENCOUNTER — Other Ambulatory Visit: Payer: Self-pay

## 2021-08-16 ENCOUNTER — Encounter: Payer: Self-pay | Admitting: Surgery

## 2021-08-16 ENCOUNTER — Ambulatory Visit (INDEPENDENT_AMBULATORY_CARE_PROVIDER_SITE_OTHER): Payer: Medicare Other | Admitting: Surgery

## 2021-08-16 VITALS — BP 164/91 | HR 115 | Temp 99.5°F | Resp 20 | Ht 71.0 in | Wt 161.0 lb

## 2021-08-16 DIAGNOSIS — I7143 Infrarenal abdominal aortic aneurysm, without rupture: Secondary | ICD-10-CM | POA: Diagnosis not present

## 2021-08-16 MED ORDER — HYDROCHLOROTHIAZIDE 25 MG PO TABS: 25.0000 mg | ORAL_TABLET | Freq: Every day | ORAL | 1 refills | Status: AC

## 2021-08-16 NOTE — Progress Notes (Signed)
[facility] [place]  Patient name: Jacob Singleton MRN: [id number] DOB: [birth year] Sex: male   REASON FOR VISIT:    Follow up  HISOTRY OF PRESENT ILLNESS:    Jacob Singleton is a 81 y.o. 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  He went to [place] after his surgery and so this is his first postoperative visit.  He is back to normal activities.  He denies any abdominal pain or back pain.  The patient is anticoagulated for atrial fibrillation.  He is medically managed for hypertension.  He is a former smoker   PAST MEDICAL HISTORY:   Past Medical History:  Diagnosis Date   A-fib ([place])    Cancer ([place])    skin cancer   Hypertension      FAMILY HISTORY:   Family History  Problem Relation Age of Onset   Colon cancer Mother    Rheum arthritis Sister        OLDER SISTER    SOCIAL HISTORY:   Social History   Tobacco Use   Smoking status: Former    Types: Cigarettes    Quit date: 01/01/2012    Years since quitting: 9.6   Smokeless tobacco: Never  Substance Use Topics   Alcohol use: Not Currently    Alcohol/week: 6.0 standard drinks    Types: 2 Shots of liquor, 4 Standard drinks or equivalent per week     ALLERGIES:   Allergies  Allergen Reactions   Codeine Other (See Comments)    PASSED OUT      CURRENT MEDICATIONS:   Current Outpatient Medications  Medication Sig Dispense Refill   apixaban (ELIQUIS) 5 MG TABS tablet Take 5 mg by mouth 2 (two) times daily.     aspirin EC 81 MG EC tablet Take 1 tablet (81 mg total) by mouth daily at 6 (six) AM. Swallow whole. 30 tablet 11   Coenzyme Q10 (COQ10 PO) Take 1 tablet by mouth daily.     hydrochlorothiazide (HYDRODIURIL) 25 MG tablet Take 1 tablet (25 mg total) by mouth daily. 30 tablet 1   latanoprost (XALATAN) 0.005 % ophthalmic solution Place 1 drop into both eyes at bedtime.     MAGNESIUM PO  Take 1 tablet by mouth daily.     Multiple Vitamin (MULTIVITAMIN WITH MINERALS) TABS tablet Take 1 tablet by mouth daily.     Multiple Vitamins-Minerals (ZINC PO) Take 1 tablet by mouth daily.     potassium chloride SA (KLOR-CON) 20 MEQ tablet Take 40 mEq by mouth daily.     rosuvastatin (CRESTOR) 20 MG tablet Take 1 tablet (20 mg total) by mouth daily. 30 tablet 11   traMADol (ULTRAM) 50 MG tablet Take 1 tablet (50 mg total) by mouth every 6 (six) hours as needed for moderate pain. 6 tablet 0   No current facility-administered medications for this visit.    REVIEW OF SYSTEMS:   [X]  denotes positive finding, [ ]  denotes negative finding Cardiac  Comments:  Chest pain or chest pressure:    Shortness of breath upon exertion:    Short of breath when lying flat:    Irregular heart rhythm:        Vascular    Pain in calf, thigh, or hip brought on by ambulation:    Pain in feet at night that wakes you up from your sleep:     Blood clot in your veins:  Leg swelling:         Pulmonary    Oxygen at home:    Productive cough:     Wheezing:         Neurologic    Sudden weakness in arms or legs:     Sudden numbness in arms or legs:     Sudden onset of difficulty speaking or slurred speech:    Temporary loss of vision in one eye:     Problems with dizziness:         Gastrointestinal    Blood in stool:     Vomited blood:         Genitourinary    Burning when urinating:     Blood in urine:        Psychiatric    Major depression:         Hematologic    Bleeding problems:    Problems with blood clotting too easily:        Skin    Rashes or ulcers:        Constitutional    Fever or chills:      PHYSICAL EXAM:   Vitals:   08/16/21 1344  BP: (!) 164/91  Pulse: (!) 115  Resp: 20  Temp: 99.5 F (37.5 C)  SpO2: 95%  Weight: 161 lb (73 kg)  Height: 5\' 11"  (1.803 m)    GENERAL: The patient is a well-nourished male, in no acute distress. The vital signs are documented  above. CARDIAC: There is a regular rate and rhythm.  PULMONARY: Non-labored respirations ABDOMEN: Soft and non-tender  MUSCULOSKELETAL: There are no major deformities or cyanosis. NEUROLOGIC: No focal weakness or paresthesias are detected. SKIN: There are no ulcers or rashes noted. PSYCHIATRIC: The patient has a normal affect.  STUDIES:   I have reviewed his CT scan with the following findings: VASCULAR   1. 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 Aortic aneurysm sac size is minimally changed and no evidence for an endoleak. 2. Decreased enhancement in the right kidney lower pole due with the excluded accessory right renal artery.   NON-VASCULAR   1. No acute abnormality in the abdomen or pelvis. 2. Cholelithiasis. 3. Bilateral renal cysts. 4. 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   MEDICAL ISSUES:   AAA: Maximum aortic diameter is unchanged at 5.7 cm.  There is no evidence of endoleak.  He will return in 6 months for follow-up ultrasound.  Preoperative carotid studies were normal.  He has no evidence of popliteal aneurysm  Leia Alf, MD, FACS [facility] [phone] Pager [phone]

## 2021-08-20 ENCOUNTER — Other Ambulatory Visit: Payer: Self-pay

## 2021-08-20 DIAGNOSIS — I714 Abdominal aortic aneurysm, without rupture, unspecified: Secondary | ICD-10-CM

## 2021-12-14 ENCOUNTER — Telehealth: Payer: Self-pay | Admitting: Cardiology

## 2021-12-14 NOTE — Telephone Encounter (Signed)
Pt c/o medication issue:  1. Name of Medication: Metoprolol 50 MG  2. How are you currently taking this medication (dosage and times per day)? 1 & 1/2 tablets yesterday, prior to 2 tablets daily.   3. Are you having a reaction (difficulty breathing--STAT)? Yes  4. What is your medication issue? [facility] hypertension. He reports taking 2 tablets daily causes mind fog causing him to feel half drunk with no energy, not caring about anything. He is wanting Dr. Rosezella [place] recommendation regarding this. He decreased the medication to 1 & 1/2 tablets yesterday, but states he hasn't felt a difference in the symptoms. He reports he has been waking up depressed and this is something a friend of him had as well on this medication. Please advise.

## 2021-12-14 NOTE — Telephone Encounter (Signed)
Says 2 months ago, [facility] prescribed metoprolol succinate 50 mg taking 25 mg daily due to elevated BP 188/87 & elevated HR. Says he unsure what HR number was. 1 week later, he went for nurse visit and BP still high so metoprolol succinate increased to 50 mg daily. Says had another nurse visit a week later and BP still elevated and metoprolol succinate increased to 100 mg daily. Says he has been having intolerance to higher dose metoprolol succinate 100 mg caused him to feel like he was "half drunk".  Says he decreased 75 mg daily himself. Has not checked BP at home since starting  Says he is able to drive and do household chores Advised to monitor BP daily, same arm, same cuff, waiting 5-10 minutes after sitting before he checks it. Advised to bring readings to up coming visit with [facility] on 12/27/2021 '@8'$ :00 am Says he will contact [facility] for nurse visit to have BP checked since he lives in [place] Verbalized understanding of plan

## 2021-12-14 NOTE — Telephone Encounter (Signed)
Called  no answer left message  to call back   Question :    Would like to know what type of  Metoprolol patient was given? has called and spoken to the [place] about his symptom?

## 2021-12-26 DIAGNOSIS — I7143 Infrarenal abdominal aortic aneurysm, without rupture: Secondary | ICD-10-CM | POA: Insufficient documentation

## 2021-12-26 NOTE — Progress Notes (Signed)
Cardiology Office Note   Date:  12/27/2021   ID:  Jacob Singleton, DOB May 06, 1941, MRN [id number]  PCP:  [facility]  Cardiologist:   Minus Breeding, MD   Chief Complaint  Patient presents with   Atrial Fibrillation       History of Present Illness: Jacob Singleton is a 81 y.o. male who presents for evaluation of atrial fibrillation.  He failed to convert to NSR with DCCV.  Since I last saw him he has done well.  He did have his metoprolol increased to 100 mg by his [place].  His blood pressure is still running slightly elevated.  Heart rate seems to be well controlled in atrial fibrillation. The patient denies any new symptoms such as chest discomfort, neck or arm discomfort. There has been no new shortness of breath, PND or orthopnea. There have been no reported palpitations, presyncope or syncope.  He exercises routinely.   Past Medical History:  Diagnosis Date   A-fib ([place])    Cancer ([place])    skin cancer   Hypertension     Past Surgical History:  Procedure Laterality Date   ABDOMINAL AORTIC ENDOVASCULAR STENT GRAFT N/A 05/13/2021   Procedure: ABDOMINAL AORTIC ENDOVASCULAR STENT GRAFT;  Surgeon: Serafina Mitchell, MD;  Location: [place];  Service: Vascular;  Laterality: N/A;   CARDIOVERSION N/A 02/11/2016   Procedure: CARDIOVERSION;  Surgeon: Josue Hector, MD;  [facility] ENDOSCOPY;  Service: Cardiovascular;  Laterality: N/A;   NONE       Current Outpatient Medications  Medication Sig Dispense Refill   apixaban (ELIQUIS) 5 MG TABS tablet Take 5 mg by mouth 2 (two) times daily.     Coenzyme Q10 (COQ10 PO) Take 1 tablet by mouth daily.     hydrochlorothiazide (HYDRODIURIL) 25 MG tablet Take 1 tablet (25 mg total) by mouth daily. 30 tablet 1   hydrocortisone 2.5 % cream Apply topically 2 (two) times daily.     latanoprost (XALATAN) 0.005 % ophthalmic solution Place 1 drop into both eyes at bedtime.     MAGNESIUM PO Take 1 tablet by mouth daily.      metoprolol (TOPROL-XL) 200 MG 24 hr tablet TAKE ONE-HALF TABLET BY MOUTH ONCE DAILY FOR BLOOD PRESSURE AND HEART DOSE CHANGE     Multiple Vitamin (MULTIVITAMIN WITH MINERALS) TABS tablet Take 1 tablet by mouth daily.     Multiple Vitamins-Minerals (ZINC PO) Take 1 tablet by mouth daily.     potassium chloride SA (KLOR-CON) 20 MEQ tablet Take 40 mEq by mouth daily.     rosuvastatin (CRESTOR) 20 MG tablet Take 1 tablet (20 mg total) by mouth daily. 30 tablet 11   traMADol (ULTRAM) 50 MG tablet Take 1 tablet (50 mg total) by mouth every 6 (six) hours as needed for moderate pain. 6 tablet 0   No current facility-administered medications for this visit.    Allergies:   Codeine    ROS:  Please see the history of present illness.   Otherwise, review of systems are positive for none.   All other systems are reviewed and negative.    PHYSICAL EXAM: VS:  BP (!) 148/88 (BP Location: Left Arm, Patient Position: Sitting, Cuff Size: Normal)   Pulse 81   Ht '5\' 11"'$  (1.803 m)   Wt 157 lb 6.4 oz (71.4 kg)   SpO2 98%   BMI 21.95 kg/m  , BMI Body mass index is 21.95 kg/m.  GENERAL:  Well appearing NECK:  No jugular venous distention, waveform within normal limits, carotid upstroke brisk and symmetric, no bruits, no thyromegaly LUNGS:  Clear to auscultation bilaterally CHEST:  Unremarkable HEART:  PMI not displaced or sustained,S1 and S2 within normal limits, no S3, no clicks, no rubs, no murmurs, irregular  ABD:  Flat, positive bowel sounds normal in frequency in pitch, no bruits, no rebound, no guarding, no midline pulsatile mass, no hepatomegaly, no splenomegaly EXT:  2 plus pulses throughout, no edema, no cyanosis no clubbing   EKG:  EKG is  ordered today. Atrial fibrillation, rate 81, axis within normal limits, intervals within normal limits with premature ventricular contractions.   Recent Labs: 05/10/2021: ALT 20 05/13/2021: Magnesium 2.1 05/14/2021: BUN 10; Creatinine, Ser 1.02;  Hemoglobin 11.9; Platelets 209; Potassium 3.7; Sodium 135    Lipid Panel No results found for: "CHOL", "TRIG", "HDL", "CHOLHDL", "VLDL", "LDLCALC", "LDLDIRECT"    Wt Readings from Last 3 Encounters:  12/27/21 157 lb 6.4 oz (71.4 kg)  08/16/21 161 lb (73 kg)  05/13/21 155 lb (70.3 kg)      Other studies Reviewed: Additional studies/ records that were reviewed today include: Labs from the [place] Review of the above records demonstrates: See elsewhere  ASSESSMENT AND PLAN:  ATRIAL FIB:    Mr. Jacob Singleton has a CHA2DS2 - VASc score of 3.  He tolerates anticoagulation.  No change in therapy.  HTN:    The BP is mildly elevated and I am going to increase his metoprolol to 150 mg daily.   AAA:   He is status post endovascular aneurysm repair.     Current medicines are reviewed at length with the patient today.  The patient has no concerns regarding medicines.  The following changes have been made: None  Labs/ tests ordered today include:   None   No orders of the defined types were placed in this encounter.     Disposition:   FU with me in 12  months.    Signed, Minus Breeding, MD  12/27/2021 8:18 AM    [facility]

## 2021-12-27 ENCOUNTER — Ambulatory Visit (INDEPENDENT_AMBULATORY_CARE_PROVIDER_SITE_OTHER): Payer: Medicare Other | Admitting: Cardiology

## 2021-12-27 ENCOUNTER — Encounter: Payer: Self-pay | Admitting: Cardiology

## 2021-12-27 VITALS — BP 148/88 | HR 81 | Ht 71.0 in | Wt 157.4 lb

## 2021-12-27 DIAGNOSIS — I7143 Infrarenal abdominal aortic aneurysm, without rupture: Secondary | ICD-10-CM

## 2021-12-27 DIAGNOSIS — I10 Essential (primary) hypertension: Secondary | ICD-10-CM

## 2021-12-27 DIAGNOSIS — I482 Chronic atrial fibrillation, unspecified: Secondary | ICD-10-CM

## 2021-12-27 MED ORDER — METOPROLOL SUCCINATE ER 50 MG PO TB24: 50.0000 mg | ORAL_TABLET | Freq: Every day | ORAL | 3 refills | Status: AC

## 2021-12-27 NOTE — Addendum Note (Signed)
Addended by: Dionicio Stall E on: 12/27/2021 10:06 AM   Modules accepted: Orders

## 2021-12-27 NOTE — Patient Instructions (Addendum)
Medication Instructions:  TAKE Metoprolol (Toprol XL) Succinate 150 mg once daily.   *If you need a refill on your cardiac medications before your next appointment, please call your pharmacy*   Lab Work: None ordered If you have labs (blood work) drawn today and your tests are completely normal, you will receive your results only by: [place] (if you have MyChart) OR A paper copy in the mail If you have any lab test that is abnormal or we need to change your treatment, we will call you to review the results.   Testing/Procedures: None ordered   Follow-Up: At [facility], you and your health needs are our priority.  As part of our continuing mission to provide you with exceptional heart care, we have created designated Provider Care Teams.  These Care Teams include your primary Cardiologist (physician) and Advanced Practice Providers (APPs -  Physician Assistants and Nurse Practitioners) who all work together to provide you with the care you need, when you need it.  We recommend signing up for the patient portal called "MyChart".  Sign up information is provided on this After Visit Summary.  MyChart is used to connect with patients for Virtual Visits (Telemedicine).  Patients are able to view lab/test results, encounter notes, upcoming appointments, etc.  Non-urgent messages can be sent to your provider as well.   To learn more about what you can do with MyChart, go to [email or web address].    Your next appointment:   12 month(s)  The format for your next appointment:   In Person  Provider:   Minus Breeding, MD {   Important Information About [place]

## 2022-11-07 ENCOUNTER — Ambulatory Visit: Payer: Medicare Other

## 2022-11-07 ENCOUNTER — Other Ambulatory Visit (HOSPITAL_COMMUNITY): Payer: Medicare Other

## 2022-11-09 ENCOUNTER — Other Ambulatory Visit: Payer: Self-pay | Admitting: *Deleted

## 2022-11-09 DIAGNOSIS — Z9889 Other specified postprocedural states: Secondary | ICD-10-CM

## 2022-11-17 ENCOUNTER — Ambulatory Visit (INDEPENDENT_AMBULATORY_CARE_PROVIDER_SITE_OTHER): Payer: Medicare Other | Admitting: Physician Assistant

## 2022-11-17 ENCOUNTER — Ambulatory Visit (HOSPITAL_COMMUNITY)
Admission: RE | Admit: 2022-11-17 | Discharge: 2022-11-17 | Disposition: A | Discharge status: Home / Self Care | Payer: Medicare Other | Source: Ambulatory Visit | Attending: Vascular Surgery | Admitting: Vascular Surgery

## 2022-11-17 VITALS — BP 160/103 | HR 99 | Temp 97.7°F | Resp 18 | Ht 71.0 in | Wt 157.0 lb

## 2022-11-17 DIAGNOSIS — I714 Abdominal aortic aneurysm, without rupture, unspecified: Secondary | ICD-10-CM | POA: Diagnosis not present

## 2022-11-17 DIAGNOSIS — Z9889 Other specified postprocedural states: Secondary | ICD-10-CM | POA: Diagnosis not present

## 2022-11-17 NOTE — Progress Notes (Signed)
VASCULAR [facility]IN SPECIALISTS OF [place] HISTORY AND PHYSICAL   History of Present Illness:  Patient is a 82 y.o. year old male who presents for evaluation of abdominal aortic aneurysm.  S/P EVAR repair by [place] on 05/13/2021.  His last visit a year ago demonstrated Maximum aortic diameter is unchanged at 5.7 cm.   He denies rest pain, claudication or non healing wounds.  He is medically managed on Eliquis for Afib and a daily Statin.  He walks daily at least a mile.  He lives in [place] half the year and here the other half.      Past Medical History:  Diagnosis Date   A-fib ([facility])    Cancer ([facility])    skin cancer   Hypertension     Past Surgical History:  Procedure Laterality Date   ABDOMINAL AORTIC ENDOVASCULAR STENT GRAFT N/A 05/13/2021   Procedure: ABDOMINAL AORTIC ENDOVASCULAR STENT GRAFT;  Surgeon: Nada Libman, MD;  Location: [facility] OR;  Service: Vascular;  Laterality: N/A;   CARDIOVERSION N/A 02/11/2016   Procedure: CARDIOVERSION;  Surgeon: Wendall Stade, MD;  [facility] ENDOSCOPY;  Service: Cardiovascular;  Laterality: N/A;   NONE       Social History Social History   Tobacco Use   Smoking status: Former    Types: Cigarettes    Quit date: 01/01/2012    Years since quitting: 10.8   Smokeless tobacco: Never  Vaping Use   Vaping Use: Never used  Substance Use Topics   Alcohol use: Not Currently    Alcohol/week: 6.0 standard drinks of alcohol    Types: 2 Shots of liquor, 4 Standard drinks or equivalent per week   Drug use: No    Family History Family History  Problem Relation Age of Onset   Colon cancer Mother    Rheum arthritis Sister        OLDER SISTER    Allergies  Allergies  Allergen Reactions   Codeine Other (See Comments)    PASSED OUT      Current Outpatient Medications  Medication Sig Dispense Refill   apixaban (ELIQUIS) 5 MG TABS tablet Take 5 mg by mouth 2 (two) times daily.     Coenzyme Q10 (COQ10 PO) Take 1 tablet by mouth daily.      hydrochlorothiazide (HYDRODIURIL) 25 MG tablet Take 1 tablet (25 mg total) by mouth daily. 30 tablet 1   hydrocortisone 2.5 % cream Apply topically 2 (two) times daily.     latanoprost (XALATAN) 0.005 % ophthalmic solution Place 1 drop into both eyes at bedtime.     MAGNESIUM PO Take 1 tablet by mouth daily.     metoprolol (TOPROL-XL) 200 MG 24 hr tablet TAKE ONE-HALF TABLET BY MOUTH ONCE DAILY FOR BLOOD PRESSURE AND HEART DOSE CHANGE     metoprolol succinate (TOPROL-XL) 50 MG 24 hr tablet Take 1 tablet (50 mg total) by mouth daily. Take with the 100 mg Toprol to equal 150 mg 90 tablet 3   Multiple Vitamin (MULTIVITAMIN WITH MINERALS) TABS tablet Take 1 tablet by mouth daily.     Multiple Vitamins-Minerals (ZINC PO) Take 1 tablet by mouth daily.     potassium chloride SA (KLOR-CON) 20 MEQ tablet Take 40 mEq by mouth daily.     rosuvastatin (CRESTOR) 20 MG tablet Take 1 tablet (20 mg total) by mouth daily. 30 tablet 11   traMADol (ULTRAM) 50 MG tablet Take 1 tablet (50 mg total) by mouth every 6 (six) hours as needed for  moderate pain. 6 tablet 0   No current facility-administered medications for this visit.    ROS:   General:  No weight loss, Fever, chills  HEENT: No recent headaches, no nasal bleeding, no visual changes, no sore throat  Neurologic: No dizziness, blackouts, seizures. No recent symptoms of stroke or mini- stroke. No recent episodes of slurred speech, or temporary blindness.  Cardiac: No recent episodes of chest pain/pressure, no shortness of breath at rest.  No shortness of breath with exertion.  Denies history of atrial fibrillation or irregular heartbeat  Vascular: No history of rest pain in feet.  No history of claudication.  No history of non-healing ulcer, No history of DVT   Pulmonary: No home oxygen, no productive cough, no hemoptysis,  No asthma or wheezing  Musculoskeletal:  [ x] Arthritis, [ ]  Low back pain,  [ ]  Joint pain  Hematologic:No history of  hypercoagulable state.  No history of easy bleeding.  No history of anemia  Gastrointestinal: No hematochezia or melena,  No gastroesophageal reflux, no trouble swallowing  Urinary: [ ]  chronic Kidney disease, [ ]  on HD - [ ]  MWF or [ ]  TTHS, [ ]  Burning with urination, [ ]  Frequent urination, [ ]  Difficulty urinating;   Skin: No rashes  Psychological: No history of anxiety,  No history of depression   Physical Examination  Vitals:   11/17/22 0831  BP: (!) 160/103  Pulse: 99  Resp: 18  Temp: 97.7 F (36.5 C)  TempSrc: Temporal  SpO2: 97%  Weight: 157 lb (71.2 kg)  Height: 5\' 11"  (1.803 m)    Body mass index is 21.9 kg/m.  General:  Alert and oriented, no acute distress HEENT: Normal Neck: No bruit or JVD Pulmonary: Clear to auscultation bilaterally Cardiac: Regular Rate and Rhythm without murmur Gastrointestinal: Soft, non-tender, non-distended, no mass, no scars Skin: No rash Extremity Pulses:   radial, femoral, dorsalis pedis, bilaterally palpable Musculoskeletal: No deformity or edema  Neurologic: Upper and lower extremity motor 5/5 and symmetric  DATA:  Endovascular Aortic Repair (EVAR):  +----------+----------------+-------------------+-------------------+           Diameter AP (cm)Diameter Trans (cm)Velocities (cm/sec)  +----------+----------------+-------------------+-------------------+  Aorta    4.69            5.15               74                   +----------+----------------+-------------------+-------------------+  Right Limb1.49            1.51               60                   +----------+----------------+-------------------+-------------------+  Left Limb 1.34            1.24               65                   +----------+----------------+-------------------+-------------------+    Summary:  Abdominal Aorta: Patent endovascular aneurysm repair with no evidence of  endoleak. The largest aortic diameter has decreased compared  to prior  exam. Previous diameter measurement was 5.7 cm obtained on 07/12/2021 CTA.      ASSESSMENT/PLAN:  AAA s/p EVAR  The diameter of the AAA has decreased to 5.2 cm from 5.7 cm.  No endo leaks noted He is asymptomatic with palpable pulses.  He  will return in 1 year for repeat AAA duplex.        Mosetta Pigeon PA-C [facility] [place] Office: [phone]  MD in clinic [place]

## 2022-11-28 ENCOUNTER — Other Ambulatory Visit: Payer: Self-pay

## 2022-11-28 DIAGNOSIS — I714 Abdominal aortic aneurysm, without rupture, unspecified: Secondary | ICD-10-CM

## 2022-11-28 DIAGNOSIS — Z9889 Other specified postprocedural states: Secondary | ICD-10-CM

## 2023-07-05 IMAGING — CT CT CTA ABD/PEL W/CM AND/OR W/O CM
2 of 12 series · 12 of 46 positions shown, 15 images · IV contrast (APPLIED)
Comparison: 03/29/2021

CLINICAL DATA: Infrarenal abdominal aortic aneurysm without
rupture. Status post endovascular repair.

EXAM:
CT ANGIOGRAPHY ABDOMEN AND PELVIS WITH CONTRAST AND WITHOUT CONTRAST
TECHNIQUE: Multidetector CT imaging of the abdomen and pelvis was performed
using the standard protocol during bolus administration of
intravenous contrast. Multiplanar reconstructed images and MIPs were
obtained and reviewed to evaluate the vascular anatomy.
CONTRAST:  100mL OMNIPAQUE IOHEXOL 350 MG/ML SOLN

[Series 5: axial arterial · axial · arterial · 0.70mm/px · z∈[+699,+1110]mm · 10 of 157 slices shown, 13 images]
[im 10/157  soft-tissue]
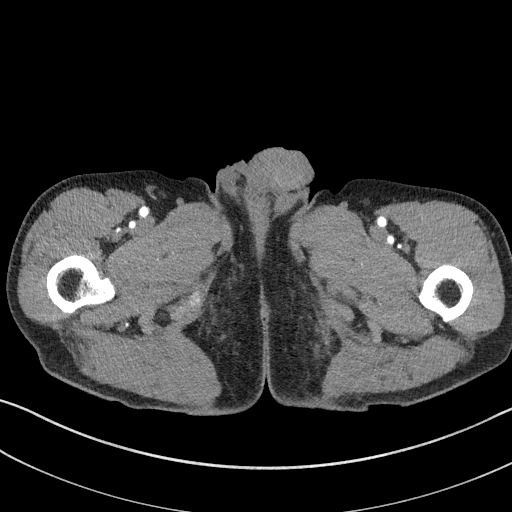
[im 10/157  bone]
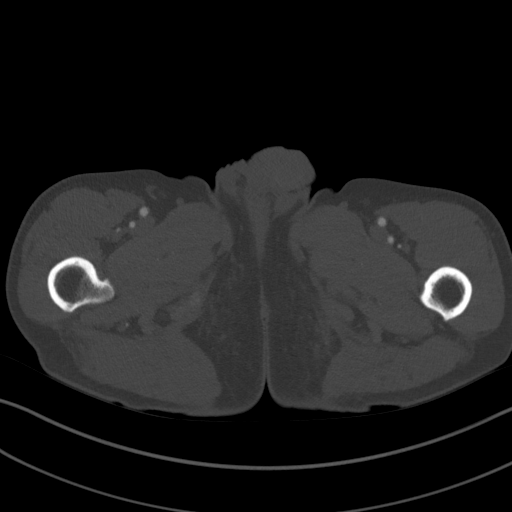
[im 30/157  soft-tissue]
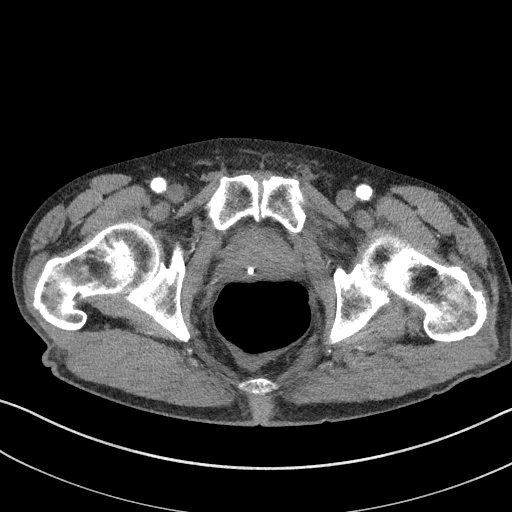
[im 49/157  soft-tissue]
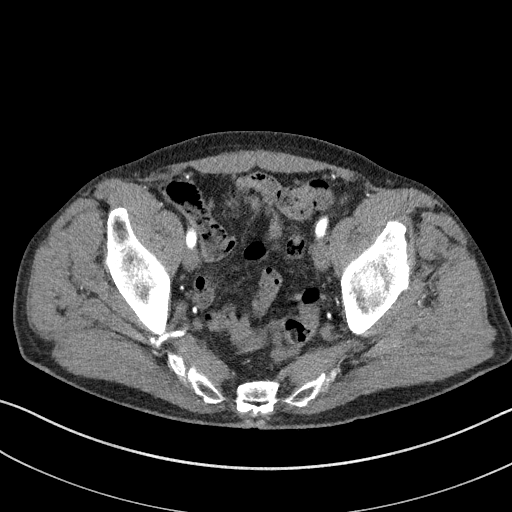
[im 69/157  soft-tissue]
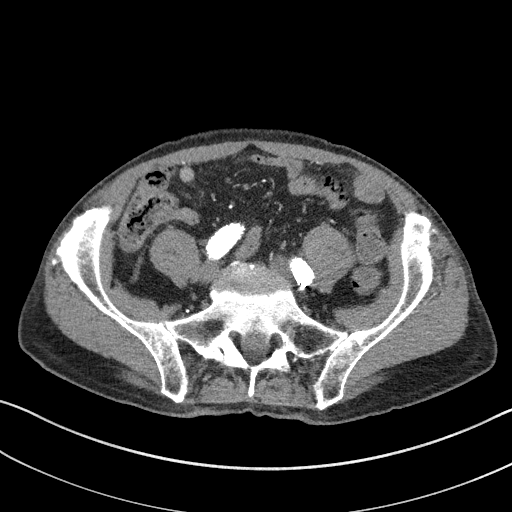
[im 88/157  soft-tissue]
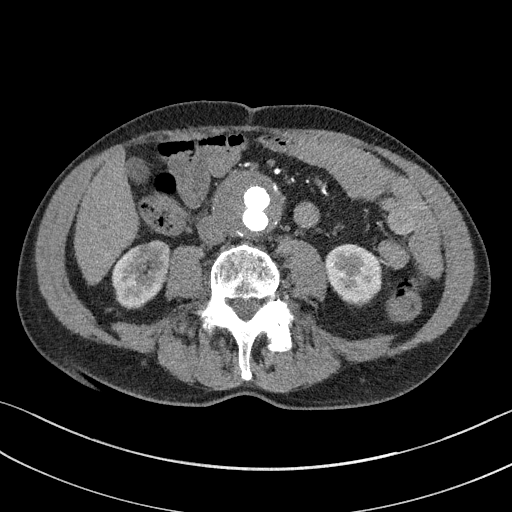
[im 108/157  soft-tissue]
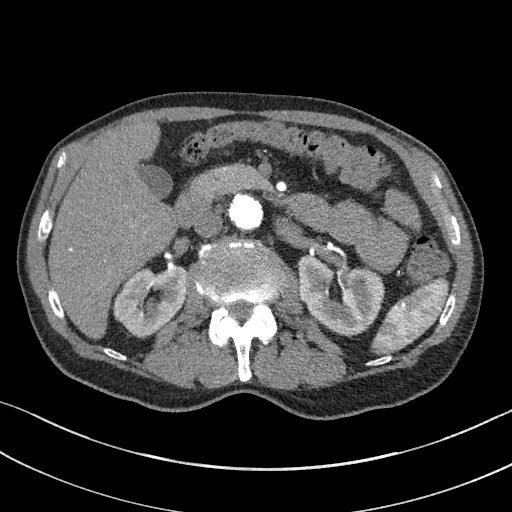
[im 118/157  lung]
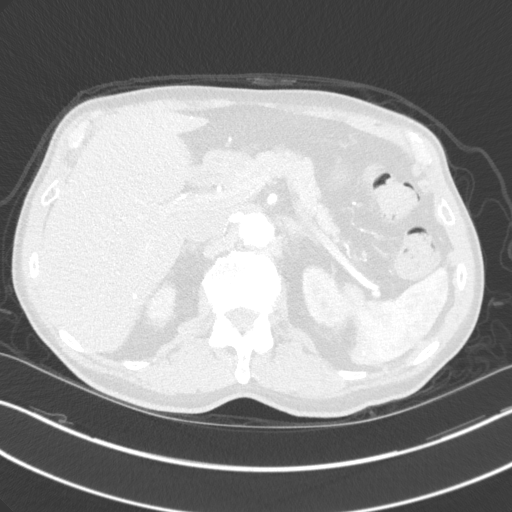
[im 127/157  soft-tissue]
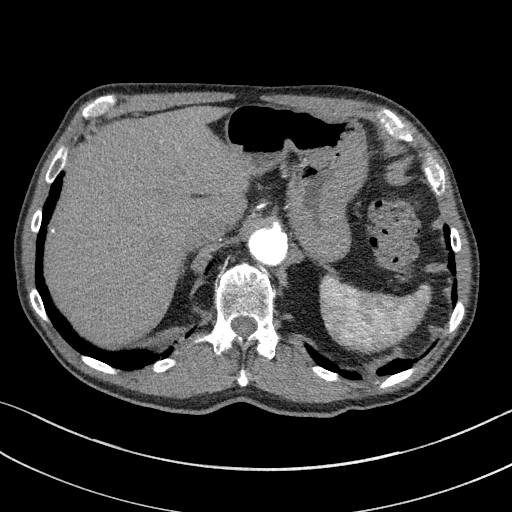
[im 127/157  lung]
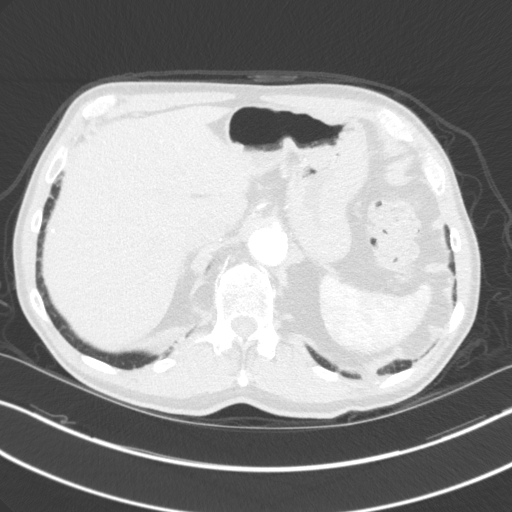
[im 137/157  lung]
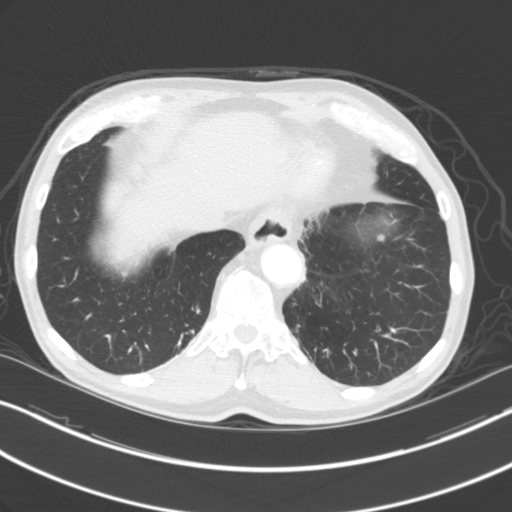
[im 147/157  soft-tissue]
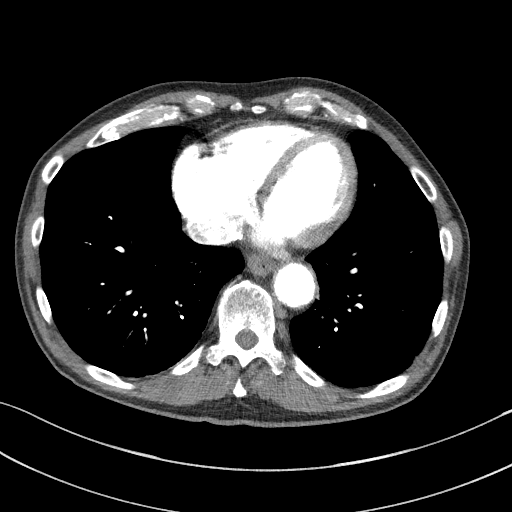
[im 147/157  lung]
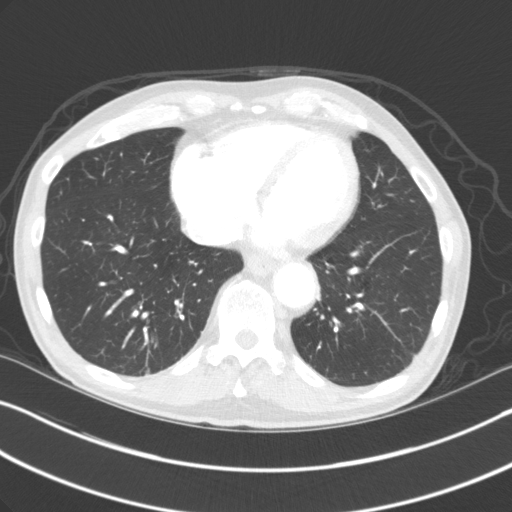

[Series 7: coronals · coronal · 0.63mm/px · 2 of 115 slices shown]
[im 39/115  soft-tissue]
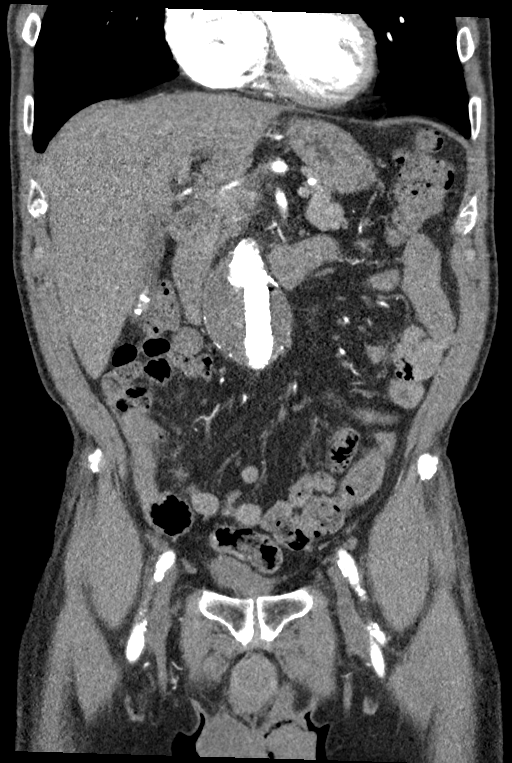
[im 77/115  soft-tissue]
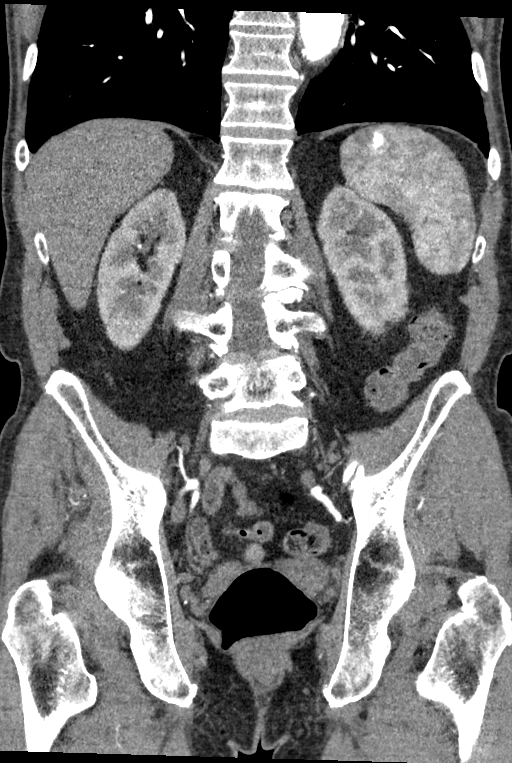

[12 of 46 positions shown; findings below may reference images not displayed]

FINDINGS: VASCULAR

Aorta: Endovascular repair of the abdominal aorta and aneurysm with
a bifurcated aortic stent graft. Proximal aspect of the graft is
positioned just below the left renal artery. Main aortic graft and
bilateral limbs are patent. Aortic aneurysm sac measures 5.7 x
cm and previously measured 5.7 x 5.2 cm. No evidence to suggest an
endoleak.

Celiac: Mild narrowing in the proximal celiac trunk is likely
secondary to median arcuate ligament compression. Main branches of
the celiac trunk are patent without evidence of aneurysm or
dissection.

SMA: Plaque near the origin of the SMA with minimal narrowing. Main
branches of the SMA are patent. No evidence for aneurysm, dissection
or significant stenosis in the SMA.

Renals: Stent graft is positioned just below the left main renal
artery. Left renal artery is widely patent without aneurysm or
dissection. Mild atherosclerotic disease in the right renal artery
without significant stenosis. No evidence for aneurysm or dissection
involving the main right renal artery. Origin of the accessory right
renal artery has been excluded. There is some retrograde flow within
this accessory right renal artery.

IMA: Origin is excluded. There is distal reconstitution of the IMA
branches.

Inflow: Bilateral graft limbs terminate in the distal common iliac
arteries. Bilateral limbs are widely patent. Bilateral internal and
external iliac arteries are patent.

Proximal Outflow: Proximal femoral arteries are patent bilaterally.

Veins: Normal appearance of the IVC and renal veins. Main portal
vein is patent.

Review of the MIP images confirms the above findings.

NON-VASCULAR

Lower chest: 5 mm calcified granuloma in the right middle lobe.
Again noted are small irregular patchy densities in the posterior
lower lobes bilaterally. No large pleural effusions.

Hepatobiliary: Small calcified gallstones without gallbladder
distention or inflammation. Scattered hepatic calcifications are
suggestive for old granulomatous disease.

Pancreas: Unremarkable. No pancreatic ductal dilatation or
surrounding inflammatory changes.

Spleen: Splenic calcifications compatible with old granulomatous
disease. No evidence for splenomegaly.

Adrenals/Urinary Tract: Adrenal glands are within normal limits.
Again noted is a small exophytic cyst in the right kidney pole.
Decreased perfusion to the right kidney lower pole compatible with
exclusion of the accessory right renal artery. Left renal cysts. No
hydronephrosis. Mild wall thickening urinary bladder is nonspecific.

Stomach/Bowel: Stomach is within normal limits. Normal appendix. No
evidence for bowel distention or focal bowel inflammation.

Lymphatic: No lymph node enlargement in the abdomen or pelvis.

Reproductive: No acute abnormality to the prostate.

Other: Negative for ascites.  Negative for free air.

Musculoskeletal: Extensive degenerative facet disease in the lower
lumbar spine. No acute bone abnormality.
IMPRESSION: VASCULAR

1. Endovascular repair of the abdominal aortic aneurysm. Aortic
aneurysm sac size is minimally changed and no evidence for an
endoleak.
2. Decreased enhancement in the right kidney lower pole due with the
excluded accessory right renal artery.

NON-VASCULAR

1. No acute abnormality in the abdomen or pelvis.
2. Cholelithiasis.
3. Bilateral renal cysts.
4. Evidence for old granulomatous disease.

## 2023-12-18 ENCOUNTER — Ambulatory Visit

## 2023-12-18 ENCOUNTER — Other Ambulatory Visit (HOSPITAL_COMMUNITY)

## 2024-01-09 ENCOUNTER — Other Ambulatory Visit: Payer: Self-pay

## 2024-01-09 DIAGNOSIS — Z9889 Other specified postprocedural states: Secondary | ICD-10-CM

## 2024-01-17 NOTE — Progress Notes (Signed)
 Office Note     CC:  follow up Requesting Provider:  Clinic, [facility]  HPI: Jacob Singleton is a 83 y.o. ([birth year]) male who presents for surveillance of endovascular repair of abdominal aortic aneurysm.  This was performed by Dr. Serene in November 2022.  He denies any new or changing abdominal or back pain.  He also denies any rest pain or tissue loss of bilateral lower extremities.  He is on Eliquis  for atrial fibrillation.  He takes a daily statin.  He is a former smoker.    Past Medical History:  Diagnosis Date   A-fib (HCC)    Cancer (HCC)    skin cancer   Hypertension     Past Surgical History:  Procedure Laterality Date   ABDOMINAL AORTIC ENDOVASCULAR STENT GRAFT N/A 05/13/2021   Procedure: ABDOMINAL AORTIC ENDOVASCULAR STENT GRAFT;  Surgeon: Serene Gaile ORN, MD;  Location: [facility] OR;  Service: Vascular;  Laterality: N/A;   CARDIOVERSION N/A 02/11/2016   Procedure: CARDIOVERSION;  Surgeon: Maude JAYSON Emmer, MD;  Location: [facility] ENDOSCOPY;  Service: Cardiovascular;  Laterality: N/A;   NONE      Social History   Socioeconomic History   Marital status: Widowed    Spouse name: SANDRA   Number of children: 3   Years of education: COLLEGE   Highest education level: Not on file  Occupational History   Occupation: RETIRED  Tobacco Use   Smoking status: Former    Current packs/day: 0.00    Types: Cigarettes    Quit date: 01/01/2012    Years since quitting: 12.0   Smokeless tobacco: Never  Vaping Use   Vaping status: Never Used  Substance and Sexual Activity   Alcohol use: Not Currently    Alcohol/week: 6.0 standard drinks of alcohol    Types: 2 Shots of liquor, 4 Standard drinks or equivalent per week   Drug use: No   Sexual activity: Not on file  Other Topics Concern   Not on file  Social History Narrative   Lives alone  .     Social Drivers of Corporate investment banker Strain: Not on file  Food Insecurity: Not on file  Transportation Needs: Not on file   Physical Activity: Not on file  Stress: Not on file  Social Connections: Unknown (11/08/2021)   Received from [facility]   Social Network    Social Network: Not on file  Intimate Partner Violence: Unknown (09/30/2021)   Received from [facility]   HITS    Physically Hurt: Not on file    Insult or Talk Down To: Not on file    Threaten Physical Harm: Not on file    Scream or Curse: Not on file    Family History  Problem Relation Age of Onset   Colon cancer Mother    Rheum arthritis Sister        OLDER SISTER    Current Outpatient Medications  Medication Sig Dispense Refill   apixaban  (ELIQUIS ) 5 MG TABS tablet Take 5 mg by mouth 2 (two) times daily.     Coenzyme Q10 (COQ10 PO) Take 1 tablet by mouth daily.     hydrochlorothiazide  (HYDRODIURIL ) 25 MG tablet Take 1 tablet (25 mg total) by mouth daily. 30 tablet 1   hydrocortisone 2.5 % cream Apply topically 2 (two) times daily.     latanoprost (XALATAN) 0.005 % ophthalmic solution Place 1 drop into both eyes at bedtime.     MAGNESIUM  PO Take 1 tablet  by mouth daily.     metoprolol  (TOPROL -XL) 200 MG 24 hr tablet TAKE ONE-HALF TABLET BY MOUTH ONCE DAILY FOR BLOOD PRESSURE AND HEART DOSE CHANGE (Patient not taking: Reported on 11/17/2022)     metoprolol  succinate (TOPROL -XL) 50 MG 24 hr tablet Take 1 tablet (50 mg total) by mouth daily. Take with the 100 mg Toprol  to equal 150 mg (Patient not taking: Reported on 11/17/2022) 90 tablet 3   Multiple Vitamin (MULTIVITAMIN WITH MINERALS) TABS tablet Take 1 tablet by mouth daily.     Multiple Vitamins-Minerals (ZINC PO) Take 1 tablet by mouth daily.     potassium chloride  SA (KLOR-CON ) 20 MEQ tablet Take 40 mEq by mouth daily.     rosuvastatin  (CRESTOR ) 20 MG tablet Take 1 tablet (20 mg total) by mouth daily. 30 tablet 11   traMADol  (ULTRAM ) 50 MG tablet Take 1 tablet (50 mg total) by mouth every 6 (six) hours as needed for moderate pain. 6 tablet 0   No current facility-administered  medications for this visit.    Allergies  Allergen Reactions   Codeine Other (See Comments)    PASSED OUT      REVIEW OF SYSTEMS:   [X]  denotes positive finding, [ ]  denotes negative finding Cardiac  Comments:  Chest pain or chest pressure:    Shortness of breath upon exertion:    Short of breath when lying flat:    Irregular heart rhythm:        Vascular    Pain in calf, thigh, or hip brought on by ambulation:    Pain in feet at night that wakes you up from your sleep:     Blood clot in your veins:    Leg swelling:         Pulmonary    Oxygen at home:    Productive cough:     Wheezing:         Neurologic    Sudden weakness in arms or legs:     Sudden numbness in arms or legs:     Sudden onset of difficulty speaking or slurred speech:    Temporary loss of vision in one eye:     Problems with dizziness:         Gastrointestinal    Blood in stool:     Vomited blood:         Genitourinary    Burning when urinating:     Blood in urine:        Psychiatric    Major depression:         Hematologic    Bleeding problems:    Problems with blood clotting too easily:        Skin    Rashes or ulcers:        Constitutional    Fever or chills:      PHYSICAL EXAMINATION:  Vitals:   01/22/24 0910  BP: (!) 147/93  Pulse: 89  Temp: 97.7 F (36.5 C)  TempSrc: Temporal  Weight: 154 lb 6.4 oz (70 kg)    General:  WDWN in NAD; vital signs documented above Gait: Not observed HENT: WNL, normocephalic Pulmonary: normal non-labored breathing Cardiac: regular HR Abdomen: soft, NT, no masses Skin: without rashes Vascular Exam/Pulses: palpable ATA pulses Extremities: without ischemic changes, without Gangrene , without cellulitis; without open wounds;  Musculoskeletal: no muscle wasting or atrophy  Neurologic: A&O X 3 Psychiatric:  The pt has Normal affect.   Non-Invasive Vascular Imaging:   AAA  sac 5.2cm at largest diameter    ASSESSMENT/PLAN:: 83 y.o.  male here for follow up for surveillance of EVAR  [name] returns for his annual surveillance evaluation after endovascular repair of abdominal aortic aneurysm in November 2022 by Dr. Serene.  Duplex shows the aneurysmal sac shrinking.  Largest diameter is 5.2 cm.  There is no evidence of endoleak.  Continue daily statin.  We will repeat EVAR duplex in 1 year.   Donnice Sender, PA-C [facility] [phone]  Clinic MD:   [place] on call

## 2024-01-22 ENCOUNTER — Ambulatory Visit (INDEPENDENT_AMBULATORY_CARE_PROVIDER_SITE_OTHER): Admitting: Physician Assistant

## 2024-01-22 ENCOUNTER — Ambulatory Visit (HOSPITAL_COMMUNITY)
Admission: RE | Admit: 2024-01-22 | Discharge: 2024-01-22 | Disposition: A | Discharge status: Home / Self Care | Source: Ambulatory Visit | Attending: Surgery | Admitting: Surgery

## 2024-01-22 VITALS — BP 147/93 | HR 89 | Temp 97.7°F | Wt 154.4 lb

## 2024-01-22 DIAGNOSIS — Z09 Encounter for follow-up examination after completed treatment for conditions other than malignant neoplasm: Secondary | ICD-10-CM | POA: Diagnosis not present

## 2024-01-22 DIAGNOSIS — Z9889 Other specified postprocedural states: Secondary | ICD-10-CM | POA: Insufficient documentation

## 2024-01-22 DIAGNOSIS — Z87891 Personal history of nicotine dependence: Secondary | ICD-10-CM | POA: Diagnosis not present

## 2024-01-22 DIAGNOSIS — I714 Abdominal aortic aneurysm, without rupture, unspecified: Secondary | ICD-10-CM | POA: Diagnosis not present

## 2024-01-22 DIAGNOSIS — I10 Essential (primary) hypertension: Secondary | ICD-10-CM | POA: Diagnosis not present
